# Patient Record
Sex: Female | Born: 1935 | Race: White | Hispanic: No | Smoking: Former smoker
Health system: Southern US, Community
[De-identification: ages and names within clinical notes are randomized; demographics above are authoritative.]

## PROBLEM LIST (undated history)

## (undated) DIAGNOSIS — M1990 Unspecified osteoarthritis, unspecified site: Secondary | ICD-10-CM

## (undated) DIAGNOSIS — K5792 Diverticulitis of intestine, part unspecified, without perforation or abscess without bleeding: Secondary | ICD-10-CM

## (undated) DIAGNOSIS — F0390 Unspecified dementia without behavioral disturbance: Secondary | ICD-10-CM

## (undated) DIAGNOSIS — R32 Unspecified urinary incontinence: Secondary | ICD-10-CM

## (undated) DIAGNOSIS — S82143A Displaced bicondylar fracture of unspecified tibia, initial encounter for closed fracture: Secondary | ICD-10-CM

## (undated) DIAGNOSIS — I10 Essential (primary) hypertension: Secondary | ICD-10-CM

## (undated) HISTORY — PX: COLON SURGERY: SHX602

## (undated) HISTORY — PX: APPENDECTOMY: SHX54

---

## 2000-01-29 ENCOUNTER — Encounter: Admission: RE | Admit: 2000-01-29 | Discharge: 2000-01-29 | Payer: Self-pay | Admitting: Internal Medicine

## 2000-01-29 ENCOUNTER — Encounter: Payer: Self-pay | Admitting: Internal Medicine

## 2001-01-30 ENCOUNTER — Encounter: Admission: RE | Admit: 2001-01-30 | Discharge: 2001-01-30 | Payer: Self-pay | Admitting: Internal Medicine

## 2001-01-30 ENCOUNTER — Encounter: Payer: Self-pay | Admitting: Internal Medicine

## 2002-02-16 ENCOUNTER — Encounter: Admission: RE | Admit: 2002-02-16 | Discharge: 2002-02-16 | Payer: Self-pay | Admitting: Internal Medicine

## 2002-02-16 ENCOUNTER — Encounter: Payer: Self-pay | Admitting: Internal Medicine

## 2003-01-02 ENCOUNTER — Encounter: Payer: Self-pay | Admitting: Otolaryngology

## 2003-01-02 ENCOUNTER — Encounter: Admission: RE | Admit: 2003-01-02 | Discharge: 2003-01-02 | Payer: Self-pay | Admitting: Otolaryngology

## 2003-01-04 ENCOUNTER — Ambulatory Visit (HOSPITAL_BASED_OUTPATIENT_CLINIC_OR_DEPARTMENT_OTHER): Admission: RE | Admit: 2003-01-04 | Discharge: 2003-01-04 | Payer: Self-pay | Admitting: Otolaryngology

## 2012-07-13 ENCOUNTER — Inpatient Hospital Stay (HOSPITAL_COMMUNITY)
Admission: EM | Admit: 2012-07-13 | Discharge: 2012-07-17 | DRG: 563 | Disposition: A | Discharge status: Skilled Nursing Facility | Payer: Medicare Other | Attending: Orthopedic Surgery | Admitting: Orthopedic Surgery

## 2012-07-13 ENCOUNTER — Emergency Department (HOSPITAL_COMMUNITY): Payer: Medicare Other

## 2012-07-13 ENCOUNTER — Encounter (HOSPITAL_COMMUNITY): Payer: Self-pay | Admitting: Emergency Medicine

## 2012-07-13 DIAGNOSIS — S82143A Displaced bicondylar fracture of unspecified tibia, initial encounter for closed fracture: Secondary | ICD-10-CM | POA: Diagnosis present

## 2012-07-13 DIAGNOSIS — A499 Bacterial infection, unspecified: Secondary | ICD-10-CM | POA: Diagnosis present

## 2012-07-13 DIAGNOSIS — Y92410 Unspecified street and highway as the place of occurrence of the external cause: Secondary | ICD-10-CM

## 2012-07-13 DIAGNOSIS — S82109A Unspecified fracture of upper end of unspecified tibia, initial encounter for closed fracture: Principal | ICD-10-CM | POA: Diagnosis present

## 2012-07-13 DIAGNOSIS — R64 Cachexia: Secondary | ICD-10-CM | POA: Diagnosis present

## 2012-07-13 DIAGNOSIS — IMO0002 Reserved for concepts with insufficient information to code with codable children: Secondary | ICD-10-CM

## 2012-07-13 DIAGNOSIS — N390 Urinary tract infection, site not specified: Secondary | ICD-10-CM | POA: Diagnosis present

## 2012-07-13 DIAGNOSIS — H9190 Unspecified hearing loss, unspecified ear: Secondary | ICD-10-CM | POA: Diagnosis present

## 2012-07-13 HISTORY — DX: Diverticulitis of intestine, part unspecified, without perforation or abscess without bleeding: K57.92

## 2012-07-13 HISTORY — DX: Unspecified urinary incontinence: R32

## 2012-07-13 HISTORY — DX: Displaced bicondylar fracture of unspecified tibia, initial encounter for closed fracture: S82.143A

## 2012-07-13 LAB — BASIC METABOLIC PANEL
CO2: 20 mEq/L (ref 19–32)
Calcium: 9.8 mg/dL (ref 8.4–10.5)
Creatinine, Ser: 0.77 mg/dL (ref 0.50–1.10)
GFR calc non Af Amer: 72 mL/min — ABNORMAL LOW (ref >90)
Glucose, Bld: 116 mg/dL — ABNORMAL HIGH (ref 70–99)
Sodium: 139 mEq/L (ref 135–145)

## 2012-07-13 LAB — CBC
MCH: 30.6 pg (ref 26.0–34.0)
MCHC: 35.9 g/dL (ref 30.0–36.0)
MCV: 85.1 fL (ref 78.0–100.0)
Platelets: DECREASED 10*3/uL (ref 150–400)
RBC: 3.76 MIL/uL — ABNORMAL LOW (ref 3.87–5.11)

## 2012-07-13 MED ORDER — MORPHINE SULFATE 2 MG/ML IJ SOLN
2.0000 mg | INTRAMUSCULAR | Status: DC | PRN
  Administered 2012-07-13: 4 mg via INTRAVENOUS
  Filled 2012-07-13: qty 2

## 2012-07-13 MED ORDER — BISACODYL 10 MG RE SUPP: 10.0000 mg | Freq: Every day | RECTAL | Status: DC | PRN

## 2012-07-13 MED ORDER — SENNA-DOCUSATE SODIUM 8.6-50 MG PO TABS: 1.0000 | ORAL_TABLET | Freq: Every day | ORAL | Status: DC

## 2012-07-13 MED ORDER — HYDROCODONE-ACETAMINOPHEN 5-325 MG PO TABS: 1.0000 | ORAL_TABLET | Freq: Four times a day (QID) | ORAL | Status: AC | PRN

## 2012-07-13 MED ORDER — SODIUM CHLORIDE 0.9 % IV SOLN
INTRAVENOUS | Status: DC
  Administered 2012-07-13: 19:00:00 via INTRAVENOUS

## 2012-07-13 MED ORDER — ENOXAPARIN SODIUM 40 MG/0.4ML ~~LOC~~ SOLN: 30.0000 mg | SUBCUTANEOUS | Status: DC

## 2012-07-13 MED ORDER — SENNA 8.6 MG PO TABS
1.0000 | ORAL_TABLET | Freq: Two times a day (BID) | ORAL | Status: DC
  Administered 2012-07-13 – 2012-07-17 (×8): 8.6 mg via ORAL
  Filled 2012-07-13 (×8): qty 1

## 2012-07-13 MED ORDER — HYDROCODONE-ACETAMINOPHEN 5-325 MG PO TABS
1.0000 | ORAL_TABLET | Freq: Four times a day (QID) | ORAL | Status: DC | PRN
  Administered 2012-07-13 – 2012-07-15 (×6): 1 via ORAL
  Administered 2012-07-15 – 2012-07-16 (×4): 2 via ORAL
  Administered 2012-07-16: 1 via ORAL
  Administered 2012-07-17: 2 via ORAL
  Filled 2012-07-13 (×3): qty 2
  Filled 2012-07-13 (×2): qty 1
  Filled 2012-07-13: qty 2
  Filled 2012-07-13 (×4): qty 1
  Filled 2012-07-13: qty 2
  Filled 2012-07-13: qty 1

## 2012-07-13 MED ORDER — MAGNESIUM CITRATE PO SOLN: 1.0000 | Freq: Once | ORAL | Status: AC | PRN

## 2012-07-13 MED ORDER — MORPHINE SULFATE 2 MG/ML IJ SOLN
0.5000 mg | INTRAMUSCULAR | Status: DC | PRN
  Administered 2012-07-14: 0.5 mg via INTRAVENOUS
  Filled 2012-07-13: qty 1

## 2012-07-13 MED ORDER — ACETAMINOPHEN 325 MG PO TABS: 650.0000 mg | ORAL_TABLET | Freq: Four times a day (QID) | ORAL | Status: DC | PRN

## 2012-07-13 MED ORDER — BISACODYL 5 MG PO TBEC: 5.0000 mg | DELAYED_RELEASE_TABLET | Freq: Every day | ORAL | Status: AC | PRN

## 2012-07-13 MED ORDER — DOCUSATE SODIUM 100 MG PO CAPS
100.0000 mg | ORAL_CAPSULE | Freq: Two times a day (BID) | ORAL | Status: DC
  Administered 2012-07-13 – 2012-07-17 (×8): 100 mg via ORAL
  Filled 2012-07-13 (×5): qty 1

## 2012-07-13 MED ORDER — ENOXAPARIN SODIUM 30 MG/0.3ML ~~LOC~~ SOLN
30.0000 mg | SUBCUTANEOUS | Status: DC
  Administered 2012-07-13 – 2012-07-15 (×3): 30 mg via SUBCUTANEOUS
  Filled 2012-07-13 (×5): qty 0.3
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
## 2012-07-14 ENCOUNTER — Encounter (HOSPITAL_COMMUNITY): Payer: Self-pay | Admitting: Orthopedic Surgery

## 2012-07-14 DIAGNOSIS — S82143A Displaced bicondylar fracture of unspecified tibia, initial encounter for closed fracture: Secondary | ICD-10-CM

## 2012-07-14 DIAGNOSIS — A499 Bacterial infection, unspecified: Secondary | ICD-10-CM | POA: Diagnosis present

## 2012-07-14 HISTORY — DX: Displaced bicondylar fracture of unspecified tibia, initial encounter for closed fracture: S82.143A

## 2012-07-14 LAB — URINALYSIS, ROUTINE W REFLEX MICROSCOPIC
Glucose, UA: NEGATIVE mg/dL
Ketones, ur: 15 mg/dL — AB
Protein, ur: NEGATIVE mg/dL
Urobilinogen, UA: 0.2 mg/dL (ref 0.0–1.0)

## 2012-07-14 LAB — URINE MICROSCOPIC-ADD ON

## 2012-07-14 MED ORDER — DIPHENHYDRAMINE HCL 12.5 MG/5ML PO ELIX: 6.2500 mg | ORAL_SOLUTION | Freq: Four times a day (QID) | ORAL | Status: DC | PRN

## 2012-07-14 MED ORDER — DIPHENHYDRAMINE-ZINC ACETATE 2-0.1 % EX CREA
TOPICAL_CREAM | Freq: Two times a day (BID) | CUTANEOUS | Status: DC | PRN
  Administered 2012-07-16: 02:00:00 via TOPICAL
  Filled 2012-07-14: qty 28

## 2012-07-14 MED ORDER — POTASSIUM CHLORIDE IN NACL 20-0.45 MEQ/L-% IV SOLN
INTRAVENOUS | Status: DC
  Administered 2012-07-14 – 2012-07-17 (×5): via INTRAVENOUS
  Filled 2012-07-14 (×7): qty 1000

## 2012-07-14 MED ORDER — ONDANSETRON HCL 4 MG/2ML IJ SOLN
4.0000 mg | Freq: Four times a day (QID) | INTRAMUSCULAR | Status: DC | PRN
  Administered 2012-07-14 – 2012-07-16 (×2): 4 mg via INTRAVENOUS
  Filled 2012-07-14 (×2): qty 2

## 2012-07-14 MED ORDER — CIPROFLOXACIN IN D5W 200 MG/100ML IV SOLN
200.0000 mg | Freq: Two times a day (BID) | INTRAVENOUS | Status: AC
  Administered 2012-07-14 – 2012-07-17 (×6): 200 mg via INTRAVENOUS
  Filled 2012-07-14 (×7): qty 100

## 2012-07-14 MED ORDER — ENSURE COMPLETE PO LIQD
237.0000 mL | Freq: Two times a day (BID) | ORAL | Status: DC
  Administered 2012-07-14 – 2012-07-17 (×6): 237 mL via ORAL

## 2012-07-14 MED ORDER — MORPHINE SULFATE 2 MG/ML IJ SOLN
1.0000 mg | INTRAMUSCULAR | Status: DC | PRN
  Administered 2012-07-17: 1 mg via INTRAVENOUS
  Filled 2012-07-14: qty 1

## 2012-07-14 NOTE — Care Management Note (Signed)
    Page 1 of 1   07/18/2012     8:50:34 AM   CARE MANAGEMENT NOTE 07/18/2012  Patient:  Cathy Blackwell, Cathy Blackwell   Account Number:  [id number]  Date Initiated:  07/14/2012  Documentation initiated by:  Lanier Clam  Subjective/Objective Assessment:   ADMITTED W/R KNEE PAIN.     Action/Plan:   FROM HOME ALONE.   Anticipated DC Date:  07/14/2012   Anticipated DC Plan:  SKILLED NURSING FACILITY  In-house referral  Clinical Social Worker      DC Planning Services  CM consult      Choice offered to / List presented to:             Status of service:  Completed, signed off Medicare Important Message given?  NA - LOS <3 / Initial given by admissions (If response is "NO", the following Medicare IM given date fields will be blank) Date Medicare IM given:   Date Additional Medicare IM given:    Discharge Disposition:  SKILLED NURSING FACILITY  Per UR Regulation:  Reviewed for med. necessity/level of care/duration of stay  If discussed at Long Length of Stay Meetings, dates discussed:    Comments:  07/14/12 KATHY MAHABIR RN,BSN NCM [phone] D/C PLANS SNF. WILL CONTINUE TO FOLLOW FOR D/C PLANS.
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
## 2012-07-14 NOTE — Progress Notes (Signed)
INITIAL ADULT NUTRITION ASSESSMENT Date: 07/14/2012   Time: 12:59 PM Reason for Assessment: Nutrition risk, weight loss, decreased intake  ASSESSMENT: Female 78 y.o.  Dx: Right tibial plateau fracture  Hx:  Past Medical History  Diagnosis Date  . Incontinence of urine   . Diverticulitis    Past Surgical History  Procedure Date  . Colon surgery     Part  of colon removed    Related Meds:     . docusate sodium  100 mg Oral BID  . enoxaparin (LOVENOX) injection  30 mg Subcutaneous Q24H  . senna  1 tablet Oral BID     Ht: 5' (152.4 cm)  Wt: 117 lb 8.1 oz (53.3 kg)  Ideal Wt: 45.5 kg  % Ideal Wt: 117  Usual Wt:  120# % Usual Wt: 98  Body mass index is 22.95 kg/(m^2).    Labs:  CMP     Component Value Date/Time   NA 139 07/13/2012 1900   K 3.7 07/13/2012 1900   CL 105 07/13/2012 1900   CO2 20 07/13/2012 1900   GLUCOSE 116* 07/13/2012 1900   BUN 19 07/13/2012 1900   CREATININE 0.77 07/13/2012 1900   CALCIUM 9.8 07/13/2012 1900   GFRNONAA 72* 07/13/2012 1900   GFRAA 83* 07/13/2012 1900    I/O last 3 completed shifts: In: 360 [P.O.:360] Out: -  Total I/O In: 120 [P.O.:120] Out: -    Diet Order: Regular  Supplements/Tube Feeding:  none  IVF:    DISCONTD: sodium chloride Last Rate: 125 mL/hr at 07/13/12 1919    Estimated Nutritional Needs:   Kcal: 1200-1300 Protein: 65-75 gm Fluid: .1.3L  Food/Nutrition Related Hx: Pt followed regular diet prior to admit.  Lived independently and usually ate only 1 meal per day.  Pt reported that patient did not eat well prior to admit.  Current intake poor. Dislikes food here.  No difficulty chewing or swallowing.  Pt has own teeth.  Very HOH.  Pt appears very thin and expect some decree of malnutrition.  Mild malnutrition related to intake of only 1 meal per day (poor intake per family) AEB < 75% intake for greater than one month, decreased body fat and muscle mass.  NUTRITION DIAGNOSIS: -Inadequate oral intake  (NI-2.1).  Status: Ongoing  RELATED TO: decreased appetite and dislike of food  AS EVIDENCE BY: reported poor intake  MONITORING/EVALUATION(Goals): Monitor:  Intake, labs, weight Goal:  Intake >50% meals and supplements to meet estimated needs  EDUCATION NEEDS: -No education needs identified at this time  INTERVENTION: Encourage po and provide preferences Ensure Complete bid  Dietitian [phone]  DOCUMENTATION CODES Per approved criteria  -Severe  malnutrition in the context of social or environmental circumstances    Derrell Lolling Anastasia Fiedler 07/14/2012, 12:59 PM
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
## 2012-07-14 NOTE — Progress Notes (Signed)
Clinical Social Work Department CLINICAL SOCIAL WORK PLACEMENT NOTE 07/14/2012  Patient:  Cathy Blackwell, Cathy Blackwell  Account Number:  [id number] Admit date:  07/13/2012  Clinical Social Worker:  Cori Razor, LCSW  Date/time:     Clinical Social Work is seeking post-discharge placement for this patient at the following level of care:   SKILLED NURSING   (*CSW will update this form in Epic as items are completed)   07/14/2012  Patient/family provided with [facility] Department of Clinical Social Work's list of facilities offering this level of care within the geographic area requested by the patient (or if unable, by the patient's family).  07/14/2012  Patient/family informed of their freedom to choose among providers that offer the needed level of care, that participate in Medicare, Medicaid or managed care program needed by the patient, have an available bed and are willing to accept the patient.    Patient/family informed of [facility]' ownership interest in [facility], as well as of the fact that they are under no obligation to receive care at this facility.  PASARR submitted to EDS on 07/14/2012 PASARR number received from EDS on 07/14/2012  FL2 transmitted to all facilities in geographic area requested by pt/family on  07/14/2012 FL2 transmitted to all facilities within larger geographic area on   Patient informed that his/her managed care company has contracts with or will negotiate with  certain facilities, including the following:     Patient/family informed of bed offers received:  07/14/2012 Patient chooses bed at  Physician recommends and patient chooses bed at    Patient to be transferred to  on   Patient to be transferred to facility by   The following physician request were entered in Epic:   Additional Comments:  Cori Razor LCSW [phone]

## 2012-07-14 NOTE — Progress Notes (Signed)
Physical Therapy Treatment Note   07/14/12 1500  PT Visit Information  Last PT Received On 07/14/12  Assistance Needed +1  PT Time Calculation  PT Start Time 1442  PT Stop Time 1450  PT Time Calculation (min) 8 min  Subjective Data  Subjective Nothing helps (with the pain)  Precautions  Precautions Knee  Required Braces or Orthoses Knee Immobilizer - Right  Knee Immobilizer - Right On at all times  Restrictions  Weight Bearing Restrictions Yes  RLE Weight Bearing NWB  Cognition  Overall Cognitive Status Appears within functional limits for tasks assessed/performed  Bed Mobility  Bed Mobility Sit to Supine;Scooting to HOB  Sit to Supine 4: Min assist;HOB flat  Scooting to [facility] 5: Supervision  Details for Bed Mobility Assistance assisted R LE onto bed, pt able to scoot up in bed without assist  Transfers  Transfers Sit to Stand;Stand to Sit;Stand Pivot Transfers  Sit to Stand 4: Min assist;With upper extremity assist;From chair/3-in-1  Stand to Sit 4: Min assist;To bed  Stand Pivot Transfers 4: Min assist  Details for Transfer Assistance verbal cues for safe technique however pt attempting to sit when not all the way next to bed to assisted pt with sitting for safety, increased verbal and visual cues for NWB as pt would demonstrate occasional TDWB  PT - End of Session  Equipment Utilized During Treatment Gait belt;Right knee immobilizer  Activity Tolerance Patient limited by pain  Patient left in bed;with call bell/phone within reach;with family/visitor present  PT - Assessment/Plan  Comments on Treatment Session Pt continues to reports 10/10 pain.  Assisted pt back to bed and applied ice packs for pain and R knee swelling.  PT Plan Discharge plan remains appropriate;Frequency remains appropriate  Follow Up Recommendations Skilled nursing facility  Equipment Recommended Defer to next venue  Acute Rehab PT Goals  PT Goal: Sit to Supine/Side - Progress Progressing toward goal    PT Goal: Sit to Stand - Progress Progressing toward goal  PT Goal: Stand to Sit - Progress Progressing toward goal  PT General Charges  $$ ACUTE PT VISIT 1 Procedure  PT Treatments  $Therapeutic Activity 8-22 mins    Zenovia Jarred, PT Pager: [phone]
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
## 2012-07-14 NOTE — Progress Notes (Signed)
Clinical Social Work Department BRIEF PSYCHOSOCIAL ASSESSMENT 07/14/2012  Patient:  Cathy Blackwell, Cathy Blackwell     Account Number:  [id number]     Admit date:  07/13/2012  Clinical Social Worker:  Candie Chroman  Date/Time:  07/14/2012 11:58 AM  Referred by:  Physician  Date Referred:  07/14/2012 Referred for  SNF Placement   Other Referral:   Interview type:  Family Other interview type:    PSYCHOSOCIAL DATA Living Status:  ALONE Admitted from facility:   Level of care:   Primary support name:  Catalina Gravel Primary support relationship to patient:  CHILD, ADULT Degree of support available:   supportive    CURRENT CONCERNS Current Concerns  Post-Acute Placement   Other Concerns:    SOCIAL WORK ASSESSMENT / PLAN Pt is a 78 yr old female living at home prior to hospitalization. CSW met with pt/daughter to assist with d/c planning. SNF placement is required at this time. CSW will meet again today with pt/daughter and son inlaw to review SNF bed  offers  placement.   Assessment/plan status:  Psychosocial Support/Ongoing Assessment of Needs Other assessment/ plan:   Information/referral to community resources:   SNF list provided.    PATIENT'S/FAMILY'S RESPONSE TO PLAN OF CARE: Pt/family are aware that SNF placement is needed..Additional questions will be addressed during this afternoon's visit.    Cori Razor LCSW [phone]

## 2012-07-15 LAB — BASIC METABOLIC PANEL
CO2: 27 mEq/L (ref 19–32)
Calcium: 9 mg/dL (ref 8.4–10.5)
Chloride: 102 mEq/L (ref 96–112)
Potassium: 4 mEq/L (ref 3.5–5.1)
Sodium: 140 mEq/L (ref 135–145)

## 2012-07-15 LAB — CBC
Hemoglobin: 10 g/dL — ABNORMAL LOW (ref 12.0–15.0)
Platelets: 83 10*3/uL — ABNORMAL LOW (ref 150–400)
RBC: 3.3 MIL/uL — ABNORMAL LOW (ref 3.87–5.11)
WBC: 9.3 10*3/uL (ref 4.0–10.5)
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
## 2012-07-15 NOTE — Progress Notes (Signed)
Pt transferred to 1532. 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 Mykala Mccready, [employer]

## 2012-07-15 NOTE — Plan of Care (Signed)
Problem: Phase III Progression Outcomes Goal: Foley discontinued Outcome: Not Applicable Date Met:  07/15/12 No foley

## 2012-07-15 NOTE — Progress Notes (Signed)
Gave daughter bed offers.  Daughter hopeful that [facility] will offer a bed.  Answered daughter's questions re: HCPOA.  Provided daughter with HCPOA documents and offered assistance with completion and notarizing.  No additional needs identified, at this time.  Weekday CSW to f/u.  Providence Crosby, LCSWA Clinical Social Work [phone]

## 2012-07-16 LAB — BASIC METABOLIC PANEL
CO2: 30 mEq/L (ref 19–32)
Chloride: 101 mEq/L (ref 96–112)
GFR calc non Af Amer: 76 mL/min — ABNORMAL LOW (ref >90)
Glucose, Bld: 104 mg/dL — ABNORMAL HIGH (ref 70–99)
Potassium: 4 mEq/L (ref 3.5–5.1)
Sodium: 135 mEq/L (ref 135–145)

## 2012-07-16 NOTE — Progress Notes (Signed)
Physical Therapy Treatment Patient Details Name: Cathy Blackwell MRN: [id number] DOB: Dec 25, 1918 Today's Date: 07/16/2012 Time: 4098-1191 PT Time Calculation (min): 19 min  PT Assessment / Plan / Recommendation Comments on Treatment Session  Pt required increased assist during today's session with pt unable to maintain upright stance without +2 assist.  Max cuing provided for safety and technique.      Follow Up Recommendations  Skilled nursing facility    Barriers to Discharge        Equipment Recommendations  Defer to next venue    Recommendations for Other Services    Frequency Min 3X/week   Plan Discharge plan remains appropriate;Frequency remains appropriate    Precautions / Restrictions Precautions Precautions: Knee Required Braces or Orthoses: Knee Immobilizer - Right Knee Immobilizer - Right: On at all times Restrictions Weight Bearing Restrictions: Yes RLE Weight Bearing: Non weight bearing   Pertinent Vitals/Pain 4/10    Mobility  Bed Mobility Bed Mobility: Supine to Sit Supine to Sit: 4: Min assist;HOB elevated Details for Bed Mobility Assistance: Assist for RLE out of bed with cues for hand placement to self assist trunk.   Transfers Transfers: Sit to Stand;Stand to Sit;Stand Pivot Transfers Sit to Stand: 1: +2 Total assist;With upper extremity assist;From elevated surface;From bed Sit to Stand: Patient Percentage: 60% Stand to Sit: 1: +2 Total assist;With upper extremity assist;With armrests;To chair/3-in-1 Stand to Sit: Patient Percentage: 30% Stand Pivot Transfers: 1: +2 Total assist Stand Pivot Transfers: Patient Percentage: 40% Details for Transfer Assistance: +2 assist to maintain upright stance throughout transfer with max cuing for maintaining NWB status and safety due to pt trying to sit without chair right behind her.  Ambulation/Gait Ambulation/Gait Assistance: Not tested (comment)    Exercises     PT Diagnosis:    PT Problem List:   PT  Treatment Interventions:     PT Goals Acute Rehab PT Goals PT Goal Formulation: With patient Time For Goal Achievement: 07/21/12 Potential to Achieve Goals: Good Pt will go Supine/Side to Sit: with supervision PT Goal: Supine/Side to Sit - Progress: Progressing toward goal Pt will go Sit to Stand: with supervision PT Goal: Sit to Stand - Progress: Not progressing (due to weakness/fatigue) Pt will go Stand to Sit: with supervision PT Goal: Stand to Sit - Progress: Not progressing (due to weakness/fatigue)  Visit Information  Last PT Received On: 07/16/12 Assistance Needed: +2    Subjective Data  Subjective: Its not really pain I have, I just don't feel good   Cognition  Overall Cognitive Status: Impaired Area of Impairment: Safety/judgement;Following commands Arousal/Alertness: Lethargic Orientation Level: Appears intact for tasks assessed Behavior During Session: Lethargic Following Commands: Follows multi-step commands inconsistently Safety/Judgement: Decreased awareness of safety precautions;Decreased safety judgement for tasks assessed Safety/Judgement - Other Comments: Provided max cuing for safety, however pt attempted to sit when chair not yet behind her.      Balance     End of Session PT - End of Session Equipment Utilized During Treatment: Gait belt;Right knee immobilizer Activity Tolerance: Patient limited by fatigue;Patient limited by pain Patient left: in chair;with call bell/phone within reach Nurse Communication: Mobility status   GP     Page, Meribeth Mattes 07/16/2012, 11:51 AM

## 2012-07-16 NOTE — Progress Notes (Signed)
Subjective: Doing ok per family who is present.     Objective: Vital signs in last 24 hours: Temp:  [97.6 F (36.4 C)-97.9 F (36.6 C)] 97.9 F (36.6 C) (09/01 0525) Pulse Rate:  [56-61] 61  (09/01 0525) Resp:  [14-16] 16  (09/01 1125) BP: (116-119)/(59-63) 119/63 mmHg (09/01 0525) SpO2:  [92 %-97 %] 92 % (09/01 0525)  Intake/Output from previous day: 08/31 0701 - 09/01 0700 In: 2828.8 [P.O.:360; I.V.:2468.8] Out: 1550 [Urine:1550] Intake/Output this shift: Total I/O In: 120 [P.O.:120] Out: 350 [Urine:350]   Basename 07/15/12 1315 07/13/12 1900  HGB 10.0* 11.5*    Basename 07/15/12 1315 07/13/12 1900  WBC 9.3 15.5*  RBC 3.30* 3.76*  HCT 28.7* 32.0*  PLT 83* PLATELET CLUMPS NOTED ON SMEAR, COUNT APPEARS DECREASED    Basename 07/16/12 0504 07/15/12 1315  NA 135 140  K 4.0 4.0  CL 101 102  CO2 30 27  BUN 11 13  CREATININE 0.64 0.70  GLUCOSE 104* 133*  CALCIUM 8.6 9.0   No results found for this basename: LABPT:2,INR:2 in the last 72 hours  Exam: min swelling right knee.  Calf nt    Assessment/Plan: Continue present care.  Waiting for transfer to snf.     OWENS,JAMES M 07/16/2012, 12:58 PM

## 2012-07-17 LAB — BASIC METABOLIC PANEL
BUN: 8 mg/dL (ref 6–23)
CO2: 29 mEq/L (ref 19–32)
Chloride: 103 mEq/L (ref 96–112)
GFR calc Af Amer: 87 mL/min — ABNORMAL LOW (ref >90)
Potassium: 4.1 mEq/L (ref 3.5–5.1)

## 2012-07-17 MED ORDER — HYDROCODONE-ACETAMINOPHEN 5-325 MG PO TABS: 1.0000 | ORAL_TABLET | Freq: Four times a day (QID) | ORAL | Status: AC | PRN

## 2012-07-17 MED ORDER — ENOXAPARIN SODIUM 30 MG/0.3ML ~~LOC~~ SOLN: 30.0000 mg | SUBCUTANEOUS | Status: DC

## 2012-07-17 NOTE — Progress Notes (Signed)
Clinical Social Work Department CLINICAL SOCIAL WORK PLACEMENT NOTE 07/17/2012  Patient:  Cathy Blackwell, Cathy Blackwell  Account Number:  [id number] Admit date:  07/13/2012  Clinical Social Worker:  Cori Razor, LCSW  Date/time:     Clinical Social Work is seeking post-discharge placement for this patient at the following level of care:   SKILLED NURSING   (*CSW will update this form in Epic as items are completed)   07/14/2012  Patient/family provided with [facility] Department of Clinical Social Work's Cathy Blackwell of facilities offering this level of care within the geographic area requested by the patient (or if unable, by the patient's family).  07/14/2012  Patient/family informed of their freedom to choose among providers that offer the needed level of care, that participate in Medicare, Medicaid or managed care program needed by the patient, have an available bed and are willing to accept the patient.    Patient/family informed of [facility]' ownership interest in [facility], as well as of the fact that they are under no obligation to receive care at this facility.  PASARR submitted to EDS on 07/14/2012 PASARR number received from EDS on 07/14/2012  FL2 transmitted to all facilities in geographic area requested by pt/family on  07/14/2012 FL2 transmitted to all facilities within larger geographic area on   Patient informed that his/her managed care company has contracts with or will negotiate with  certain facilities, including the following:     Patient/family informed of bed offers received:  07/14/2012 Patient chooses bed at [facility] Physician recommends and patient chooses bed at    Patient to be transferred to [facility] on  07/17/2012 Patient to be transferred to facility by EMS  The following physician request were entered in Epic:   Additional Comments:  Cori Razor LCSW [phone]

## 2012-07-17 NOTE — Progress Notes (Signed)
Subjective: Doing ok.  Bed available at snf.     Objective: Vital signs in last 24 hours: Temp:  [97.8 F (36.6 C)-98.2 F (36.8 C)] 97.8 F (36.6 C) (09/02 0442) Pulse Rate:  [55-64] 55  (09/02 0442) Resp:  [14-20] 20  (09/02 0442) BP: (151-155)/(58-71) 151/58 mmHg (09/02 0442) SpO2:  [96 %-97 %] 96 % (09/02 0442)  Intake/Output from previous day: 09/01 0701 - 09/02 0700 In: 170 [P.O.:170] Out: 2770 [Urine:2770] Intake/Output this shift: Total I/O In: 60 [P.O.:60] Out: 650 [Urine:650]   Basename 07/15/12 1315  HGB 10.0*    Basename 07/15/12 1315  WBC 9.3  RBC 3.30*  HCT 28.7*  PLT 83*    Basename 07/17/12 0405 07/16/12 0504  NA 137 135  K 4.1 4.0  CL 103 101  CO2 29 30  BUN 8 11  CREATININE 0.67 0.64  GLUCOSE 104* 104*  CALCIUM 9.3 8.6   No results found for this basename: LABPT:2,INR:2 in the last 72 hours  Exam:  Immobilizer on.  Nvi.   Assessment/Plan: Transfer to snf today.  Summary complete.     Khalise Billard M 07/17/2012, 12:06 PM

## 2012-07-17 NOTE — Progress Notes (Addendum)
CSW assisting with d/c planning to SNF. [facility] contacted this am to check bed availability. Awaiting return call from Admissions Coordinator. Expect to hear back by 10am. Will continue to follow to assist with d/c planning to SNF.  Cori Razor LCSW [phone]  [facility] contacted CSW with SNF bed offer. Bed is available today if pt is ready for d/c.   Cori Razor LCSW [phone]
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
## 2013-01-22 ENCOUNTER — Other Ambulatory Visit: Payer: Self-pay

## 2013-01-22 ENCOUNTER — Encounter (HOSPITAL_COMMUNITY): Payer: Self-pay | Admitting: *Deleted

## 2013-01-22 ENCOUNTER — Emergency Department (HOSPITAL_COMMUNITY): Payer: Medicare Other

## 2013-01-22 ENCOUNTER — Emergency Department (HOSPITAL_COMMUNITY)
Admission: EM | Admit: 2013-01-22 | Discharge: 2013-01-23 | Disposition: A | Discharge status: Skilled Nursing Facility | Payer: Medicare Other | Attending: Emergency Medicine | Admitting: Emergency Medicine

## 2013-01-22 DIAGNOSIS — Z87891 Personal history of nicotine dependence: Secondary | ICD-10-CM | POA: Insufficient documentation

## 2013-01-22 DIAGNOSIS — Y9210 Unspecified residential institution as the place of occurrence of the external cause: Secondary | ICD-10-CM | POA: Insufficient documentation

## 2013-01-22 DIAGNOSIS — Z8719 Personal history of other diseases of the digestive system: Secondary | ICD-10-CM | POA: Insufficient documentation

## 2013-01-22 DIAGNOSIS — R4182 Altered mental status, unspecified: Secondary | ICD-10-CM | POA: Insufficient documentation

## 2013-01-22 DIAGNOSIS — Z043 Encounter for examination and observation following other accident: Secondary | ICD-10-CM | POA: Insufficient documentation

## 2013-01-22 DIAGNOSIS — Z87828 Personal history of other (healed) physical injury and trauma: Secondary | ICD-10-CM | POA: Insufficient documentation

## 2013-01-22 DIAGNOSIS — J3489 Other specified disorders of nose and nasal sinuses: Secondary | ICD-10-CM | POA: Insufficient documentation

## 2013-01-22 DIAGNOSIS — N390 Urinary tract infection, site not specified: Secondary | ICD-10-CM | POA: Insufficient documentation

## 2013-01-22 DIAGNOSIS — Y939 Activity, unspecified: Secondary | ICD-10-CM | POA: Insufficient documentation

## 2013-01-22 DIAGNOSIS — W19XXXA Unspecified fall, initial encounter: Secondary | ICD-10-CM | POA: Insufficient documentation

## 2013-01-22 DIAGNOSIS — Z79899 Other long term (current) drug therapy: Secondary | ICD-10-CM | POA: Insufficient documentation

## 2013-01-22 DIAGNOSIS — R509 Fever, unspecified: Secondary | ICD-10-CM | POA: Insufficient documentation

## 2013-01-22 DIAGNOSIS — Z7982 Long term (current) use of aspirin: Secondary | ICD-10-CM | POA: Insufficient documentation

## 2013-01-22 LAB — POCT I-STAT, CHEM 8
BUN: 14 mg/dL (ref 6–23)
Calcium, Ion: 1.16 mmol/L (ref 1.13–1.30)
Chloride: 96 mEq/L (ref 96–112)

## 2013-01-22 LAB — CBC WITH DIFFERENTIAL/PLATELET
Basophils Relative: 1 % (ref 0–1)
Eosinophils Relative: 1 % (ref 0–5)
Hemoglobin: 11.8 g/dL — ABNORMAL LOW (ref 12.0–15.0)
Lymphs Abs: 1.7 10*3/uL (ref 0.7–4.0)
MCH: 31.4 pg (ref 26.0–34.0)
MCV: 88.3 fL (ref 78.0–100.0)
Monocytes Absolute: 0.7 10*3/uL (ref 0.1–1.0)
RBC: 3.76 MIL/uL — ABNORMAL LOW (ref 3.87–5.11)

## 2013-01-22 LAB — URINALYSIS, ROUTINE W REFLEX MICROSCOPIC
Bilirubin Urine: NEGATIVE
Specific Gravity, Urine: 1.017 (ref 1.005–1.030)
pH: 6 (ref 5.0–8.0)

## 2013-01-22 LAB — URINE MICROSCOPIC-ADD ON

## 2013-01-22 LAB — POCT I-STAT TROPONIN I: Troponin i, poc: 0.01 ng/mL (ref 0.00–0.08)

## 2013-01-22 LAB — CG4 I-STAT (LACTIC ACID): Lactic Acid, Venous: 1.33 mmol/L (ref 0.5–2.2)

## 2013-01-22 MED ORDER — SULFAMETHOXAZOLE-TMP DS 800-160 MG PO TABS: 1.0000 | ORAL_TABLET | Freq: Two times a day (BID) | ORAL | Status: AC

## 2013-01-22 MED ORDER — SULFAMETHOXAZOLE-TMP DS 800-160 MG PO TABS
1.0000 | ORAL_TABLET | Freq: Once | ORAL | Status: AC
  Administered 2013-01-22: 1 via ORAL
  Filled 2013-01-22: qty 1

## 2013-01-22 NOTE — ED Notes (Signed)
Patient transported to X-ray 

## 2013-01-22 NOTE — ED Provider Notes (Signed)
History     CSN: [id number]  Arrival date & time 01/22/13  1933   First MD Initiated Contact with Patient 01/22/13 2121      Chief Complaint  Patient presents with  . Fall  . Urinary Tract Infection    HPI  The patient presents after a series of falls, with generalized complaints.  She denies any focal pain, confusion, disorientation, nausea. Per report the patient had 2 falls within the past 24 hours, both from a seated height.  There is also report of new cough, congestion, fever of 101 at the patient's care facility. History of present illness is per the patient's family members. The patient herself states that she was in her usual state of health prior to the onset of symptoms.     Past Medical History  Diagnosis Date  . Incontinence of urine   . Diverticulitis   . Fracture of tibial plateau, closed 07/14/2012    Past Surgical History  Procedure Laterality Date  . Colon surgery      Part  of colon removed  . Fracture surgery    . Appendectomy      No family history on file.  History  Substance Use Topics  . Smoking status: Former Smoker -- 10 years    Quit date: 07/13/1969  . Smokeless tobacco: Never Used  . Alcohol Use: No     Comment: quit drinking in late 1970's - "was a heavy drinker at one time"    OB History   Grav Para Term Preterm Abortions TAB SAB Ect Mult Living                  Review of Systems  Constitutional:       Per HPI, otherwise negative  HENT:       Per HPI, otherwise negative  Respiratory:       Per HPI, otherwise negative  Cardiovascular:       Per HPI, otherwise negative  Gastrointestinal: Negative for nausea, vomiting and diarrhea.  Endocrine:       Negative aside from HPI  Genitourinary:       Neg aside from HPI   Musculoskeletal:       Per HPI, otherwise negative  Skin: Negative.   Neurological: Negative for dizziness, weakness, light-headedness and numbness.    Allergies  Review of patient's allergies indicates  no known allergies.  Home Medications   Current Outpatient Rx  Name  Route  Sig  Dispense  Refill  . aspirin 81 MG chewable tablet   Oral   Chew 81 mg by mouth daily.         . calcium-vitamin D (OSCAL WITH D) 500-200 MG-UNIT per tablet   Oral   Take 1 tablet by mouth daily.         Marland Kitchen loperamide (IMODIUM A-D) 2 MG tablet   Oral   Take 4 mg by mouth as needed for diarrhea or loose stools.         . mirtazapine (REMERON) 7.5 MG tablet   Oral   Take 7.5 mg by mouth at bedtime.         Marland Kitchen QUEtiapine (SEROQUEL) 25 MG tablet   Oral   Take 25 mg by mouth at bedtime.           BP 110/56  Pulse 84  Temp(Src) 99.9 F (37.7 C) (Oral)  Resp 18  SpO2 94%  Physical Exam  Nursing note and vitals reviewed. Constitutional: She is oriented  to person, place, and time. She appears well-developed and well-nourished. No distress.  HENT:  Head: Normocephalic and atraumatic.  Eyes: Conjunctivae and EOM are normal.  Neck: Neck supple. No spinous process tenderness present. No rigidity. No edema present.  Cardiovascular: Normal rate and regular rhythm.   Murmur heard. Pulmonary/Chest: Effort normal and breath sounds normal. No stridor. No respiratory distress.  Abdominal: She exhibits no distension.  Musculoskeletal: She exhibits no edema.  Lymphadenopathy:    She has no cervical adenopathy.  Neurological: She is alert and oriented to person, place, and time. No cranial nerve deficit. She exhibits normal muscle tone. Coordination normal.  Skin: Skin is warm and dry.  Psychiatric: She has a normal mood and affect.    ED Course  Procedures (including critical care time)  Labs Reviewed  URINALYSIS, ROUTINE W REFLEX MICROSCOPIC  CBC WITH DIFFERENTIAL   No results found.   No diagnosis found.  Cardiac 95 sinus rhythm normal Pulse ox 99% room air normal    Date: 01/22/2013  Rate: 72  Rhythm: normal sinus rhythm  QRS Axis: left  Intervals: normal  ST/T Wave  abnormalities: nonspecific ST/T changes  Conduction Disutrbances:none  Narrative Interpretation:   Old EKG Reviewed: unchanged  Borderline  11:34 PM On repeat exam there are no notable changes.  Patient remains, appearing.  Results discussed with the patient and her family  MDM  This elderly female presents after several falls, with new temperature.  On my exam she is in no distress.  Vital signs are stable aside from mild temperature.  The patient's labs, urinalysis are consistent with a urinary tract infection.  There is no evidence of pneumonia, nor significant suspicion of systemic systemic illness.  The patient is a resident of a assisted care facility, was discharged to this location.        Gerhard Munch, MD 01/22/13 [phone]

## 2013-01-22 NOTE — ED Notes (Signed)
Pt accompanied by family daughter and son in law, lives at [place]. Facility had concerns with pt urinary status. She had temp of 101.1 at the AL facility and needed to be further assessed. Family Stated when they came to see patient she seemed to be have unsteady gait and  Dizzy. Reported the she fell at facility on Wednesday and OOB this a.m.    denies acute change in mental status but states she is not herself. At baseline pt is more spry.

## 2013-01-24 LAB — URINE CULTURE

## 2013-01-26 NOTE — ED Notes (Signed)
+   Urine Patient treated with Bactrim-sensitive to same-chart appended per protocol MD. 

## 2013-03-01 ENCOUNTER — Inpatient Hospital Stay (HOSPITAL_COMMUNITY)
Admission: EM | Admit: 2013-03-01 | Discharge: 2013-03-05 | DRG: 549 | Disposition: A | Discharge status: Skilled Nursing Facility | Payer: Medicare Other | Attending: Internal Medicine | Admitting: Internal Medicine

## 2013-03-01 DIAGNOSIS — D696 Thrombocytopenia, unspecified: Secondary | ICD-10-CM | POA: Diagnosis present

## 2013-03-01 DIAGNOSIS — M25532 Pain in left wrist: Secondary | ICD-10-CM

## 2013-03-01 DIAGNOSIS — F329 Major depressive disorder, single episode, unspecified: Secondary | ICD-10-CM | POA: Diagnosis present

## 2013-03-01 DIAGNOSIS — A499 Bacterial infection, unspecified: Secondary | ICD-10-CM

## 2013-03-01 DIAGNOSIS — M25432 Effusion, left wrist: Secondary | ICD-10-CM | POA: Diagnosis present

## 2013-03-01 DIAGNOSIS — M009 Pyogenic arthritis, unspecified: Principal | ICD-10-CM | POA: Diagnosis present

## 2013-03-01 DIAGNOSIS — N390 Urinary tract infection, site not specified: Secondary | ICD-10-CM | POA: Diagnosis present

## 2013-03-01 DIAGNOSIS — D72829 Elevated white blood cell count, unspecified: Secondary | ICD-10-CM | POA: Diagnosis present

## 2013-03-01 DIAGNOSIS — E876 Hypokalemia: Secondary | ICD-10-CM | POA: Diagnosis present

## 2013-03-01 DIAGNOSIS — F3289 Other specified depressive episodes: Secondary | ICD-10-CM | POA: Diagnosis present

## 2013-03-01 HISTORY — DX: Unspecified osteoarthritis, unspecified site: M19.90

## 2013-03-01 NOTE — ED Notes (Signed)
Per family report: pt from a nursing home: pt began to complain of left hand pain.  Nursing home call family to pick her up.  Pt denies falling or injuring her hand.  Pt's hand is swollen compared to the right hand.  Pt has a good radial pulse, cap refill but limited ability to move fingers and bend wrist.

## 2013-03-02 ENCOUNTER — Encounter (HOSPITAL_COMMUNITY): Payer: Self-pay | Admitting: *Deleted

## 2013-03-02 ENCOUNTER — Inpatient Hospital Stay (HOSPITAL_COMMUNITY): Payer: Medicare Other | Admitting: Registered Nurse

## 2013-03-02 ENCOUNTER — Encounter (HOSPITAL_COMMUNITY)
Admission: EM | Disposition: A | Discharge status: Skilled Nursing Facility | Payer: Self-pay | Source: Home / Self Care | Attending: Internal Medicine

## 2013-03-02 ENCOUNTER — Emergency Department (HOSPITAL_COMMUNITY): Payer: Medicare Other

## 2013-03-02 ENCOUNTER — Encounter (HOSPITAL_COMMUNITY): Payer: Self-pay | Admitting: Registered Nurse

## 2013-03-02 DIAGNOSIS — M25539 Pain in unspecified wrist: Secondary | ICD-10-CM

## 2013-03-02 DIAGNOSIS — M25432 Effusion, left wrist: Secondary | ICD-10-CM | POA: Diagnosis present

## 2013-03-02 DIAGNOSIS — D72829 Elevated white blood cell count, unspecified: Secondary | ICD-10-CM | POA: Diagnosis present

## 2013-03-02 HISTORY — PX: I&D EXTREMITY: SHX5045

## 2013-03-02 LAB — URINALYSIS, ROUTINE W REFLEX MICROSCOPIC
Bilirubin Urine: NEGATIVE
Nitrite: NEGATIVE
Specific Gravity, Urine: 1.013 (ref 1.005–1.030)
Urobilinogen, UA: 0.2 mg/dL (ref 0.0–1.0)

## 2013-03-02 LAB — CBC WITH DIFFERENTIAL/PLATELET
Basophils Absolute: 0 10*3/uL (ref 0.0–0.1)
HCT: 30.1 % — ABNORMAL LOW (ref 36.0–46.0)
Hemoglobin: 11 g/dL — ABNORMAL LOW (ref 12.0–15.0)
Lymphocytes Relative: 24 % (ref 12–46)
Lymphs Abs: 3.2 10*3/uL (ref 0.7–4.0)
Lymphs Abs: 1.5 10*3/uL (ref 0.7–4.0)
MCH: 31.2 pg (ref 26.0–34.0)
MCV: 91.2 fL (ref 78.0–100.0)
Monocytes Relative: 10 % (ref 3–12)
Monocytes Relative: 7 % (ref 3–12)
Neutro Abs: 8.7 10*3/uL — ABNORMAL HIGH (ref 1.7–7.7)
Neutro Abs: 10.8 10*3/uL — ABNORMAL HIGH (ref 1.7–7.7)
Neutrophils Relative %: 65 % (ref 43–77)
RBC: 3.53 MIL/uL — ABNORMAL LOW (ref 3.87–5.11)
RDW: 14 % (ref 11.5–15.5)
WBC: 13.4 10*3/uL — ABNORMAL HIGH (ref 4.0–10.5)
WBC: 13.3 10*3/uL — ABNORMAL HIGH (ref 4.0–10.5)

## 2013-03-02 LAB — SYNOVIAL CELL COUNT + DIFF, W/ CRYSTALS
Crystals, Fluid: UNDETERMINED
Crystals, Fluid: NONE SEEN
Lymphocytes-Synovial Fld: 7 % (ref 0–20)
Lymphocytes-Synovial Fld: 9 % (ref 0–20)
Monocyte-Macrophage-Synovial Fluid: 2 % — ABNORMAL LOW (ref 50–90)
Monocyte-Macrophage-Synovial Fluid: 2 % — ABNORMAL LOW (ref 50–90)
Neutrophil, Synovial: 89 % — ABNORMAL HIGH (ref 0–25)
WBC, Synovial: UNDETERMINED /mm3 (ref 0–200)
WBC, Synovial: 9975 /mm3 — ABNORMAL HIGH (ref 0–200)

## 2013-03-02 LAB — GLUCOSE, CAPILLARY

## 2013-03-02 LAB — BASIC METABOLIC PANEL
BUN: 11 mg/dL (ref 6–23)
BUN: 11 mg/dL (ref 6–23)
CO2: 26 mEq/L (ref 19–32)
Chloride: 99 mEq/L (ref 96–112)
GFR calc Af Amer: >90 mL/min (ref >90)
GFR calc non Af Amer: 80 mL/min — ABNORMAL LOW (ref >90)
Glucose, Bld: 104 mg/dL — ABNORMAL HIGH (ref 70–99)
Potassium: 3.7 mEq/L (ref 3.5–5.1)
Potassium: 3.6 mEq/L (ref 3.5–5.1)
Sodium: 135 mEq/L (ref 135–145)
Sodium: 134 mEq/L — ABNORMAL LOW (ref 135–145)

## 2013-03-02 LAB — SURGICAL PCR SCREEN: Staphylococcus aureus: NEGATIVE

## 2013-03-02 SURGERY — IRRIGATION AND DEBRIDEMENT EXTREMITY
Anesthesia: General | Site: Wrist | Laterality: Left | Wound class: Dirty or Infected

## 2013-03-02 MED ORDER — DEXTROSE 5 % IV SOLN
1.0000 g | INTRAVENOUS | Status: DC
  Filled 2013-03-02: qty 10

## 2013-03-02 MED ORDER — MORPHINE SULFATE 2 MG/ML IJ SOLN
2.0000 mg | INTRAMUSCULAR | Status: DC | PRN
  Administered 2013-03-02: 1 mg via INTRAVENOUS
  Administered 2013-03-03 (×2): 2 mg via INTRAVENOUS
  Filled 2013-03-02 (×3): qty 1

## 2013-03-02 MED ORDER — ONDANSETRON HCL 4 MG/2ML IJ SOLN: 4.0000 mg | Freq: Four times a day (QID) | INTRAMUSCULAR | Status: DC | PRN

## 2013-03-02 MED ORDER — FENTANYL CITRATE 0.05 MG/ML IJ SOLN
INTRAMUSCULAR | Status: DC | PRN
  Administered 2013-03-02: 25 ug via INTRAVENOUS
  Administered 2013-03-02: 50 ug via INTRAVENOUS

## 2013-03-02 MED ORDER — MORPHINE SULFATE 2 MG/ML IJ SOLN
1.0000 mg | Freq: Four times a day (QID) | INTRAMUSCULAR | Status: DC | PRN
  Administered 2013-03-02: 1 mg via INTRAVENOUS
  Filled 2013-03-02: qty 1

## 2013-03-02 MED ORDER — QUETIAPINE FUMARATE 25 MG PO TABS
25.0000 mg | ORAL_TABLET | Freq: Every day | ORAL | Status: DC
  Administered 2013-03-02 – 2013-03-04 (×3): 25 mg via ORAL
  Filled 2013-03-02 (×4): qty 1

## 2013-03-02 MED ORDER — LIDOCAINE HCL 2 % IJ SOLN
20.0000 mL | Freq: Once | INTRAMUSCULAR | Status: AC
  Administered 2013-03-02: 400 mg
  Filled 2013-03-02: qty 20

## 2013-03-02 MED ORDER — MEPERIDINE HCL 50 MG/ML IJ SOLN: 6.2500 mg | INTRAMUSCULAR | Status: DC | PRN

## 2013-03-02 MED ORDER — BUPIVACAINE HCL (PF) 0.25 % IJ SOLN
INTRAMUSCULAR | Status: AC
  Filled 2013-03-02: qty 30

## 2013-03-02 MED ORDER — MORPHINE SULFATE 4 MG/ML IJ SOLN
4.0000 mg | Freq: Once | INTRAMUSCULAR | Status: AC
  Administered 2013-03-02: 4 mg via INTRAVENOUS
  Filled 2013-03-02: qty 1

## 2013-03-02 MED ORDER — SODIUM CHLORIDE 0.9 % IV SOLN
INTRAVENOUS | Status: DC
  Administered 2013-03-02: 12:00:00 via INTRAVENOUS

## 2013-03-02 MED ORDER — PIPERACILLIN-TAZOBACTAM 3.375 G IVPB
3.3750 g | Freq: Once | INTRAVENOUS | Status: AC
  Administered 2013-03-02: 3.375 g via INTRAVENOUS
  Filled 2013-03-02 (×2): qty 50

## 2013-03-02 MED ORDER — 0.9 % SODIUM CHLORIDE (POUR BTL) OPTIME
TOPICAL | Status: DC | PRN
  Administered 2013-03-02: 1000 mL

## 2013-03-02 MED ORDER — VANCOMYCIN HCL IN DEXTROSE 1-5 GM/200ML-% IV SOLN
1000.0000 mg | Freq: Once | INTRAVENOUS | Status: AC
  Administered 2013-03-02: 1000 mg via INTRAVENOUS
  Filled 2013-03-02: qty 200

## 2013-03-02 MED ORDER — DEXTROSE 5 % IV SOLN
1.0000 g | INTRAVENOUS | Status: DC
  Administered 2013-03-03 – 2013-03-05 (×3): 1 g via INTRAVENOUS
  Filled 2013-03-02 (×3): qty 10

## 2013-03-02 MED ORDER — OXYCODONE HCL 5 MG PO TABS
5.0000 mg | ORAL_TABLET | ORAL | Status: DC | PRN
  Administered 2013-03-02 – 2013-03-03 (×3): 5 mg via ORAL
  Filled 2013-03-02 (×3): qty 1

## 2013-03-02 MED ORDER — FENTANYL CITRATE 0.05 MG/ML IJ SOLN: 25.0000 ug | INTRAMUSCULAR | Status: DC | PRN

## 2013-03-02 MED ORDER — ADULT MULTIVITAMIN W/MINERALS CH
1.0000 | ORAL_TABLET | Freq: Every day | ORAL | Status: DC
  Administered 2013-03-02 – 2013-03-05 (×4): 1 via ORAL
  Filled 2013-03-02 (×4): qty 1

## 2013-03-02 MED ORDER — SODIUM CHLORIDE 0.9 % IR SOLN
Status: DC | PRN
  Administered 2013-03-02: 3000 mL

## 2013-03-02 MED ORDER — ACETAMINOPHEN 325 MG PO TABS
650.0000 mg | ORAL_TABLET | Freq: Four times a day (QID) | ORAL | Status: DC | PRN
  Administered 2013-03-04 – 2013-03-05 (×3): 650 mg via ORAL
  Filled 2013-03-02 (×3): qty 2

## 2013-03-02 MED ORDER — MIRTAZAPINE 7.5 MG PO TABS
7.5000 mg | ORAL_TABLET | Freq: Every day | ORAL | Status: DC
  Administered 2013-03-02 – 2013-03-04 (×3): 7.5 mg via ORAL
  Filled 2013-03-02 (×4): qty 1

## 2013-03-02 MED ORDER — DEXTROSE 5 % IV SOLN
1.0000 g | Freq: Once | INTRAVENOUS | Status: AC
  Administered 2013-03-02: 1 g via INTRAVENOUS
  Filled 2013-03-02: qty 10

## 2013-03-02 MED ORDER — ONDANSETRON HCL 4 MG PO TABS: 4.0000 mg | ORAL_TABLET | Freq: Four times a day (QID) | ORAL | Status: DC | PRN

## 2013-03-02 MED ORDER — PROMETHAZINE HCL 25 MG/ML IJ SOLN: 6.2500 mg | INTRAMUSCULAR | Status: DC | PRN

## 2013-03-02 MED ORDER — VANCOMYCIN HCL IN DEXTROSE 750-5 MG/150ML-% IV SOLN
750.0000 mg | INTRAVENOUS | Status: DC
  Administered 2013-03-03 – 2013-03-05 (×3): 750 mg via INTRAVENOUS
  Filled 2013-03-02 (×4): qty 150

## 2013-03-02 MED ORDER — BUPIVACAINE HCL 0.25 % IJ SOLN
INTRAMUSCULAR | Status: DC | PRN
  Administered 2013-03-02: 30 mL

## 2013-03-02 MED ORDER — ACETAMINOPHEN 650 MG RE SUPP: 650.0000 mg | Freq: Four times a day (QID) | RECTAL | Status: DC | PRN

## 2013-03-02 MED ORDER — LIDOCAINE HCL (CARDIAC) 10 MG/ML IV SOLN
INTRAVENOUS | Status: DC | PRN
  Administered 2013-03-02: 50 mg via INTRAVENOUS

## 2013-03-02 MED ORDER — MIDAZOLAM HCL 5 MG/5ML IJ SOLN
INTRAMUSCULAR | Status: DC | PRN
  Administered 2013-03-02: 0.5 mg via INTRAVENOUS

## 2013-03-02 MED ORDER — ENSURE COMPLETE PO LIQD
237.0000 mL | Freq: Two times a day (BID) | ORAL | Status: DC
  Administered 2013-03-03 – 2013-03-05 (×4): 237 mL via ORAL

## 2013-03-02 MED ORDER — ONDANSETRON HCL 4 MG/2ML IJ SOLN
INTRAMUSCULAR | Status: DC | PRN
  Administered 2013-03-02: 4 mg via INTRAVENOUS

## 2013-03-02 MED ORDER — PROPOFOL 10 MG/ML IV BOLUS
INTRAVENOUS | Status: DC | PRN
  Administered 2013-03-02: 90 mg via INTRAVENOUS

## 2013-03-02 SURGICAL SUPPLY — 35 items
BAG ZIPLOCK 12X15 (MISCELLANEOUS) ×2 IMPLANT
BANDAGE GAUZE ELAST BULKY 4 IN (GAUZE/BANDAGES/DRESSINGS) IMPLANT
BNDG ELASTIC 2 VLCR STRL LF (GAUZE/BANDAGES/DRESSINGS) ×4 IMPLANT
CANISTER SUCTION 2500CC (MISCELLANEOUS) IMPLANT
CLOTH BEACON ORANGE TIMEOUT ST (SAFETY) ×2 IMPLANT
CORDS BIPOLAR (ELECTRODE) IMPLANT
CUFF TOURN SGL QUICK 18 (TOURNIQUET CUFF) IMPLANT
CUFF TOURN SGL QUICK 24 (TOURNIQUET CUFF)
CUFF TRNQT CYL 24X4X40X1 (TOURNIQUET CUFF) IMPLANT
DRAIN PENROSE 18X1/2 LTX STRL (DRAIN) IMPLANT
DRAPE SURG 17X11 SM STRL (DRAPES) IMPLANT
DRSG XEROFORM 1X8 (GAUZE/BANDAGES/DRESSINGS) ×2 IMPLANT
ELECT REM PT RETURN 9FT ADLT (ELECTROSURGICAL) ×2
ELECTRODE REM PT RTRN 9FT ADLT (ELECTROSURGICAL) ×1 IMPLANT
GAUZE XEROFORM 1X8 LF (GAUZE/BANDAGES/DRESSINGS) ×2 IMPLANT
GLOVE BIOGEL M STRL SZ7.5 (GLOVE) ×2 IMPLANT
GLOVE BIOGEL PI IND STRL 6.5 (GLOVE) ×1 IMPLANT
GLOVE BIOGEL PI IND STRL 7.0 (GLOVE) ×1 IMPLANT
GLOVE BIOGEL PI IND STRL 7.5 (GLOVE) ×1 IMPLANT
GLOVE BIOGEL PI INDICATOR 6.5 (GLOVE) ×1
GLOVE BIOGEL PI INDICATOR 7.0 (GLOVE) ×1
GLOVE BIOGEL PI INDICATOR 7.5 (GLOVE) ×1
HANDPIECE INTERPULSE COAX TIP (DISPOSABLE)
KIT BASIN OR (CUSTOM PROCEDURE TRAY) ×2 IMPLANT
MANIFOLD NEPTUNE II (INSTRUMENTS) ×2 IMPLANT
NEEDLE HYPO 22GX1.5 SAFETY (NEEDLE) IMPLANT
NS IRRIG 1000ML POUR BTL (IV SOLUTION) ×2 IMPLANT
PACK LOWER EXTREMITY WL (CUSTOM PROCEDURE TRAY) ×2 IMPLANT
POSITIONER SURGICAL ARM (MISCELLANEOUS) IMPLANT
SET CYSTO W/LG BORE CLAMP LF (SET/KITS/TRAYS/PACK) ×2 IMPLANT
SET HNDPC FAN SPRY TIP SCT (DISPOSABLE) IMPLANT
SPONGE GAUZE 4X4 12PLY (GAUZE/BANDAGES/DRESSINGS) ×2 IMPLANT
SWAB COLLECTION DEVICE MRSA (MISCELLANEOUS) ×2 IMPLANT
SYR CONTROL 10ML LL (SYRINGE) IMPLANT
TUBE ANAEROBIC SPECIMEN COL (MISCELLANEOUS) IMPLANT

## 2013-03-02 NOTE — ED Notes (Signed)
[id number]<AV> Expected date:<BR> Expected time:<BR> Means of arrival:<BR> Comments:<BR> Hold for rm 2

## 2013-03-02 NOTE — ED Notes (Signed)
Pt sts to writer she does not want to use the ice pack.

## 2013-03-02 NOTE — Progress Notes (Signed)
ANTIBIOTIC CONSULT NOTE - INITIAL  Pharmacy Consult for vancomycin Indication: possible septic arthritis  No Known Allergies  Patient Measurements: Height: 5\' 2"  (157.5 cm) Weight: 112 lb (50.803 kg) IBW/kg (Calculated) : 50.1  Vital Signs: Temp: 97.7 F (36.5 C) (04/18 0747) Temp src: Oral (04/18 0747) BP: 131/55 mmHg (04/18 0747) Pulse Rate: 58 (04/18 0747) Intake/Output from previous day:   Intake/Output from this shift:    Labs:  Recent Labs  03/02/13 0205  WBC 13.4*  HGB 11.0*  PLT 138*  CREATININE 0.59   Estimated Creatinine Clearance: 36.2 ml/min (by C-G formula based on Cr of 0.59). No results found for this basename: VANCOTROUGH, VANCOPEAK, VANCORANDOM, GENTTROUGH, GENTPEAK, GENTRANDOM, TOBRATROUGH, TOBRAPEAK, TOBRARND, AMIKACINPEAK, AMIKACINTROU, AMIKACIN,  in the last 72 hours   Microbiology: No results found for this or any previous visit (from the past 720 hour(s)).  Medical History: Past Medical History  Diagnosis Date  . Incontinence of urine   . Diverticulitis   . Fracture of tibial plateau, closed 07/14/2012  . Osteoarthritis     Medications:  Scheduled:  . [COMPLETED] cefTRIAXone (ROCEPHIN)  IV  1 g Intravenous Once  . [START ON 03/03/2013] cefTRIAXone (ROCEPHIN)  IV  1 g Intravenous Q24H  . [COMPLETED] lidocaine  20 mL Infiltration Once  . mirtazapine  7.5 mg Oral QHS  . [COMPLETED]  morphine injection  4 mg Intravenous Once  . [COMPLETED]  morphine injection  4 mg Intravenous Once  . QUEtiapine  25 mg Oral QHS  . [COMPLETED] vancomycin  1,000 mg Intravenous Once  . [DISCONTINUED] cefTRIAXone (ROCEPHIN)  IV  1 g Intravenous Q24H   Infusions:  . sodium chloride     Assessment: 79 y.o. female history of depression who lives in nursing home was brought to the ER patient was complaining of increasing pain in her left wrist since last night. Patient's daughter states that patient may have had swelling and pain in the left wrist for last 2  days which has gradually worsened. Patient did not have any fever chills or any fall or trauma. X-rays done in the ER show possibility of CPPD arthritis. Since patient has significant pain and leukocytosis at this time patient has been admitted for further management. ER physician Dr. Patria Mane tried to aspirate the joint but was not successful. Differential includes gouty arthritis vs septic arthritis  Vancomycin per pharmacy dosing (Rocephin per Md)  Slightly elevated WBC, afebrile, CrCl ~36 ml/min   Goal of Therapy:  Vancomycin trough level 15-20 mcg/ml  Plan:  1) Vancomycin 750mg  IV q24 per current weight and estimated renal function 2) Vanc trough at steady state prn   Cathy Blackwell, PharmD, BCPS Pager [phone] 03/02/2013 10:21 AM

## 2013-03-02 NOTE — Consult Note (Signed)
Reason for Consult:swelling of L hand Referring Physician: ER  Cathy Blackwell is an 79 y.o. right handed female.  HPI: pt presented to ER with pain aand swelling of L wrist for a couple days, ? Longer.  No prior history of gout. Denies injury to L wrist. Recent UTI infection.  C/o pain at rest and worse with movement.  Past Medical History  Diagnosis Date  . Incontinence of urine   . Diverticulitis   . Fracture of tibial plateau, closed 07/14/2012  . Osteoarthritis     Past Surgical History  Procedure Laterality Date  . Colon surgery      Part  of colon removed  . Fracture surgery    . Appendectomy      History reviewed. No pertinent family history.  Social History:  reports that she quit smoking about 43 years ago. She has never used smokeless tobacco. She reports that she does not drink alcohol or use illicit drugs.  Allergies: No Known Allergies  Medications: I have reviewed the patient's current medications.  Results for orders placed during the hospital encounter of 03/01/13 (from the past 48 hour(s))  CBC WITH DIFFERENTIAL     Status: Abnormal   Collection Time    03/02/13  2:05 AM      Result Value Range   WBC 13.4 (*) 4.0 - 10.5 K/uL   RBC 3.53 (*) 3.87 - 5.11 MIL/uL   Hemoglobin 11.0 (*) 12.0 - 15.0 g/dL   HCT 16.1 (*) 09.6 - 04.5 %   MCV 89.5  78.0 - 100.0 fL   MCH 31.2  26.0 - 34.0 pg   MCHC 34.8  30.0 - 36.0 g/dL   RDW 40.9  81.1 - 91.4 %   Platelets 138 (*) 150 - 400 K/uL   Neutrophils Relative 65  43 - 77 %   Neutro Abs 8.7 (*) 1.7 - 7.7 K/uL   Lymphocytes Relative 24  12 - 46 %   Lymphs Abs 3.2  0.7 - 4.0 K/uL   Monocytes Relative 10  3 - 12 %   Monocytes Absolute 1.3 (*) 0.1 - 1.0 K/uL   Eosinophils Relative 2  0 - 5 %   Eosinophils Absolute 0.2  0.0 - 0.7 K/uL   Basophils Relative 0  0 - 1 %   Basophils Absolute 0.0  0.0 - 0.1 K/uL  BASIC METABOLIC PANEL     Status: Abnormal   Collection Time    03/02/13  2:05 AM      Result Value Range   Sodium 135  135 - 145 mEq/L   Potassium 3.7  3.5 - 5.1 mEq/L   Chloride 99  96 - 112 mEq/L   CO2 26  19 - 32 mEq/L   Glucose, Bld 104 (*) 70 - 99 mg/dL   BUN 11  6 - 23 mg/dL   Creatinine, Ser 7.82  0.50 - 1.10 mg/dL   Calcium 9.6  8.4 - 95.6 mg/dL   GFR calc non Af Amer 78 (*) >90 mL/min   GFR calc Af Amer >90  >90 mL/min   Comment:            The eGFR has been calculated     using the CKD EPI equation.     This calculation has not been     validated in all clinical     situations.     eGFR's persistently     <90 mL/min signify     possible Chronic Kidney  Disease.  CELL COUNT + DIFF,  W/ CRYST-SYNVL FLD     Status: Abnormal   Collection Time    03/02/13  4:05 AM      Result Value Range   Color, Synovial RED (*) YELLOW   Appearance-Synovial CLOUDY (*) CLEAR   Crystals, Fluid       Value: SPECIMEN/CONTAINER TYPE INAPPROPRIATE FOR ORDERED TEST, UNABLE TO PERFORM   WBC, Synovial SPECIMEN CLOTTED, UNABLE TO PERFORM CELL COUNT  0 - 200 /cu mm   Neutrophil, Synovial 90 (*) 0 - 25 %   Lymphocytes-Synovial Fld 7  0 - 20 %   Monocyte-Macrophage-Synovial Fluid 2 (*) 50 - 90 %   Eosinophils-Synovial 1  0 - 1 %   Other Cells-SYN SPECIMEN CLOTTED, UNABLE TO PERFORM CELL COUNT      Dg Wrist Complete Left  03/02/2013  *RADIOLOGY REPORT*  Clinical Data: Pain and swelling of the dorsal aspect of the left wrist.  LEFT WRIST - COMPLETE 3+ VIEW  Comparison: None.  Findings: The patient has extensive chondrocalcinosis in the radiocarpal joint as well as at the first carpal metacarpal joint consistent with CPPD arthritis.  No fractures.  IMPRESSION: CPPD arthritis.   Original Report Authenticated By: Francene Boyers, M.D.    Dg Hand Complete Left  03/02/2013  *RADIOLOGY REPORT*  Clinical Data: Pain and swelling of the left hand.  LEFT HAND - COMPLETE 3+ VIEW  Comparison: None.  Findings: Diffuse osteoarthritis of the interphalangeal joints and of the metacarpal phalangeal joints.  There is  chondrocalcinosis in the radiocarpal joint and at the first carpal metacarpal joint.  No fracture or dislocation.  IMPRESSION: Diffuse arthritic changes.  No acute abnormalities.   Original Report Authenticated By: Francene Boyers, M.D.     Pertinent items are noted in HPI. Temp:  [98.1 F (36.7 C)] 98.1 F (36.7 C) (04/18 0010) Pulse Rate:  [74] 74 (04/18 0010) Resp:  [20] 20 (04/18 0010) BP: (145)/(72) 145/72 mmHg (04/18 0010) SpO2:  [98 %] 98 % (04/18 0010) Weight:  [50.803 kg (112 lb)] 50.803 kg (112 lb) (04/18 0010) General appearance: alert and cooperative Resp: clear to auscultation bilaterally Cardio: regular rate and rhythm GI: soft, non-tender; bowel sounds normal; no masses,  no organomegaly Extremities: extremities normal, atraumatic, no cyanosis or edema and edema except for edema, sl erythema of L wrist, pain with passive and active motion of wrist, no obvious sign of truama to external skin, grossly n/v intact distally   Assessment/Plan: Pain, swelling L wrist, increased wbc - concern for infected wrist joint. Plan: Wrist aspirated small amount of fluid obtained, will send for analysis; cont treatment with abx, splint for wrist;  Will f/u with results.  Ritchie Klee CHRISTOPHER 03/02/2013, 7:21 AM

## 2013-03-02 NOTE — Care Management Note (Signed)
    Page 1 of 1   03/02/2013     12:23:25 PM   CARE MANAGEMENT NOTE 03/02/2013  Patient:  Cathy Blackwell, Cathy Blackwell   Account Number:  [id number]  Date Initiated:  03/02/2013  Documentation initiated by:  Lorenda Ishihara  Subjective/Objective Assessment:   79 yo female admitted with possible septic arthritis. PTA live at SNF.     Action/Plan:   Return to SNF when stable.   Anticipated DC Date:  03/05/2013   Anticipated DC Plan:  SKILLED NURSING FACILITY  In-house referral  Clinical Social Worker      DC Planning Services  CM consult      Choice offered to / List presented to:             Status of service:  Completed, signed off Medicare Important Message given?   (If response is "NO", the following Medicare IM given date fields will be blank) Date Medicare IM given:   Date Additional Medicare IM given:    Discharge Disposition:  SKILLED NURSING FACILITY  Per UR Regulation:  Reviewed for med. necessity/level of care/duration of stay  If discussed at Long Length of Stay Meetings, dates discussed:    Comments:

## 2013-03-02 NOTE — Transfer of Care (Signed)
Immediate Anesthesia Transfer of Care Note  Patient: Cathy Blackwell  Procedure(s) Performed: Procedure(s) with comments: IRRIGATION AND DEBRIDEMENT EXTREMITY (Left) - liter saline 14 g angio cath  Patient Location: PACU  Anesthesia Type:General  Level of Consciousness: awake, confused and responds to stimulation  Airway & Oxygen Therapy: Patient Spontanous Breathing and Patient connected to face mask oxygen  Post-op Assessment: Report given to PACU RN, Post -op Vital signs reviewed and stable and Patient moving all extremities X 4  Post vital signs: stable  Complications: No apparent anesthesia complications

## 2013-03-02 NOTE — ED Provider Notes (Signed)
Medical screening examination/treatment/procedure(s) were conducted as a shared visit with non-physician practitioner(s) and myself. I personally evaluated the patient during the encounter   ARTHOCENTESIS Date/Time: 03/02/2013 5:10 AM  Performed by: Lyanne Co  Authorized by: Lyanne Co  Consent: Verbal consent obtained. Risks and benefits: risks, benefits and alternatives were discussed Consent given by: patient  Required items: required blood products, implants, devices, and special equipment available  Patient identity confirmed: verbally with patient  Time out: Immediately prior to procedure a "time out" was called to verify the correct patient, procedure, equipment, support staff and site/side marked as required. Indications: joint swelling, pain and possible septic joint  Body area: wrist  Joint: left wrist  Local anesthesia used: yes Local anesthetic: lidocaine 2% without epinephrine Anesthetic total: 3 ml Patient sedated: no Preparation: Patient was prepped and draped in the usual sterile fashion. Needle gauge: 20 G  Approach: posterior Aspirate: bloody Aspirate amount: 0.5 ml Patient tolerance: Patient tolerated the procedure well with no immediate complications. Unfortunately a small amount of synovial fluid that was aspirated clotted. I was unable to get a culture, cell count, Gram stain. This may represent crystal-induced arthritis however it's difficult to exclude septic arthritis. The patient was started on IV antibiotics. I spoke with the hand surgeon Dr. Izora Ribas who will see the patient in the hospital. The patient was admitted to the hospitalist service for ongoing evaluation and treatment.  Lyanne Co, MD  03/02/13 1610   Lyanne Co, MD 03/02/13 [phone]

## 2013-03-02 NOTE — ED Notes (Signed)
Bed:WA07<BR> Expected date:<BR> Expected time:<BR> Means of arrival:<BR> Comments:<BR>

## 2013-03-02 NOTE — ED Notes (Addendum)
Pt alert and oriented. Respirations even and unlabored. Bilateral rise and fall of chest. Skin warm and dry. In no acute distress. Denies needs. Delay explained.

## 2013-03-02 NOTE — Progress Notes (Signed)
[facility] PROGRESS NOTE  AHLIA LEMANSKI MVH:[id number] DOB: 01/25/1919 DOA: 03/01/2013 PCP: No primary provider on file.  Assessment/Plan: 1. Left wrist pain and swelling differentials include gouty arthritis versus septic arthritis - continue with empiric antibiotics vancomycin and ceftriaxone and the pain relief medications. Hand surgeon Dr. Izora Ribas to see.  2. Depression - continue home medications. 3. Leukocytosis - probably from #1 reason. 4.   Code Status: full code Family Communication: family at bedside Disposition Plan: pending.    Consultants:  Hand surgeon  Procedures: Aspiration of the left wrist. Antibiotics:  Vancomycin   Rocephin 4/18  HPI/Subjective: Requesting to eat. Pain is controlled.   Objective: Filed Vitals:   03/02/13 1500 03/02/13 1515 03/02/13 1520 03/02/13 1538  BP: 141/56 136/84  150/67  Pulse: 87 66 83   Temp: 97.9 F (36.6 C)  97.4 F (36.3 C) 98.4 F (36.9 C)  TempSrc:      Resp: 21 15 18 16   Height:      Weight:      SpO2: 100% 96% 92% 96%    Intake/Output Summary (Last 24 hours) at 03/02/13 1555 Last data filed at 03/02/13 1528  Gross per 24 hour  Intake    750 ml  Output      0 ml  Net    750 ml   Filed Weights   03/02/13 0010  Weight: 50.803 kg (112 lb)    Exam:   General:  Alert afebrile comfortable  Cardiovascular: s1s2  Respiratory: CTAB, no wheezing or rhonchi  Abdomen: soft NT ND BS+  Musculoskeletal: LEFT WRIST SWELLING, in a splint.   Data Reviewed: Basic Metabolic Panel:  Recent Labs Lab 03/02/13 0205 03/02/13 1020  NA 135 134*  K 3.7 3.6  CL 99 98  CO2 26 27  GLUCOSE 104* 116*  BUN 11 11  CREATININE 0.59 0.54  CALCIUM 9.6 9.1   Liver Function Tests: No results found for this basename: AST, ALT, ALKPHOS, BILITOT, PROT, ALBUMIN,  in the last 168 hours No results found for this basename: LIPASE, AMYLASE,  in the last 168 hours No results found for this basename: AMMONIA,  in  the last 168 hours CBC:  Recent Labs Lab 03/02/13 0205 03/02/13 1020  WBC 13.4* 13.3*  NEUTROABS 8.7* 10.8*  HGB 11.0* 10.4*  HCT 31.6* 30.1*  MCV 89.5 91.2  PLT 138* 118*   Cardiac Enzymes: No results found for this basename: CKTOTAL, CKMB, CKMBINDEX, TROPONINI,  in the last 168 hours BNP (last 3 results) No results found for this basename: PROBNP,  in the last 8760 hours CBG: No results found for this basename: GLUCAP,  in the last 168 hours  Recent Results (from the past 240 hour(s))  BODY FLUID CULTURE     Status: None   Collection Time    03/02/13  7:40 AM      Result Value Range Status   Specimen Description SYNOVIAL LT WRIST   Final   Special Requests NONE   Final   Gram Stain     Final   Value: FEW WBC PRESENT, PREDOMINANTLY PMN     NO ORGANISMS SEEN     Gram Stain Report Called to,Read Back By and Verified With: Gram Stain Report Called to,Read Back By and Verified With: DR COLEY 03/02/2013 1:26PM BY MILSH   Culture PENDING   Incomplete   Report Status PENDING   Incomplete  SURGICAL PCR SCREEN     Status: None   Collection Time  03-31-2013 12:50 PM      Result Value Range Status   MRSA, PCR NEGATIVE  NEGATIVE Final   Staphylococcus aureus NEGATIVE  NEGATIVE Final   Comment:            The Xpert SA Assay (FDA     approved for NASAL specimens     in patients over 51 years of age),     is one component of     a comprehensive surveillance     program.  Test performance has     been validated by [employer] for patients greater     than or equal to 40 year old.     It is not intended     to diagnose infection nor to     guide or monitor treatment.     Studies: Dg Wrist Complete Left  03-31-13  *RADIOLOGY REPORT*  Clinical Data: Pain and swelling of the dorsal aspect of the left wrist.  LEFT WRIST - COMPLETE 3+ VIEW  Comparison: None.  Findings: The patient has extensive chondrocalcinosis in the radiocarpal joint as well as at the first carpal  metacarpal joint consistent with CPPD arthritis.  No fractures.  IMPRESSION: CPPD arthritis.   Original Report Authenticated By: Francene Boyers, M.D.    Dg Hand Complete Left  Mar 31, 2013  *RADIOLOGY REPORT*  Clinical Data: Pain and swelling of the left hand.  LEFT HAND - COMPLETE 3+ VIEW  Comparison: None.  Findings: Diffuse osteoarthritis of the interphalangeal joints and of the metacarpal phalangeal joints.  There is chondrocalcinosis in the radiocarpal joint and at the first carpal metacarpal joint.  No fracture or dislocation.  IMPRESSION: Diffuse arthritic changes.  No acute abnormalities.   Original Report Authenticated By: Francene Boyers, M.D.     Scheduled Meds: . [START ON 03/03/2013] cefTRIAXone (ROCEPHIN)  IV  1 g Intravenous Q24H  . mirtazapine  7.5 mg Oral QHS  . piperacillin-tazobactam (ZOSYN)  IV  3.375 g Intravenous Once  . QUEtiapine  25 mg Oral QHS  . vancomycin  750 mg Intravenous Q24H   Continuous Infusions:   Principal Problem:   Swelling of joint, wrist, left Active Problems:   Leucocytosis        Sheyenne Konz  [facility] Pager [phone]  If 7PM-7AM, please contact night-coverage at [email or web address], password [facility] 2013-03-31, 3:55 PM  LOS: 1 day

## 2013-03-02 NOTE — ED Provider Notes (Signed)
Medical screening examination/treatment/procedure(s) were conducted as a shared visit with non-physician practitioner(s) and myself.  I personally evaluated the patient during the encounter  ARTHOCENTESIS Date/Time: 03/02/2013 5:10 AM Performed by: Lyanne Co Authorized by: Lyanne Co Consent: Verbal consent obtained. Risks and benefits: risks, benefits and alternatives were discussed Consent given by: patient Required items: required blood products, implants, devices, and special equipment available Patient identity confirmed: verbally with patient Time out: Immediately prior to procedure a "time out" was called to verify the correct patient, procedure, equipment, support staff and site/side marked as required. Indications: joint swelling, pain and possible septic joint  Body area: wrist Joint: left wrist Local anesthesia used: yes Local anesthetic: lidocaine 2% without epinephrine Anesthetic total: 3 ml Patient sedated: no Preparation: Patient was prepped and draped in the usual sterile fashion. Needle gauge: 20 G Approach: posterior Aspirate: bloody Aspirate amount: 0.5 ml Patient tolerance: Patient tolerated the procedure well with no immediate complications.   Unfortunately a small amount of synovial fluid that was aspirated clotted.  I was unable to get a culture, cell count, Gram stain.  This may represent crystal-induced arthritis however it's difficult to exclude septic arthritis.  The patient was started on IV antibiotics.  I spoke with the hand surgeon Dr. Izora Ribas who will see the patient in the hospital.  The patient was admitted to the hospitalist service for ongoing evaluation and treatment.   Lyanne Co, MD 03/02/13 [phone]

## 2013-03-02 NOTE — ED Notes (Signed)
Made EDP aware of synovial specimen. No new orders at this time.

## 2013-03-02 NOTE — H&P (Signed)
[facility] History and Physical  Cathy Blackwell AVW:[id number] DOB: [birth year] DOA: 03/01/2013  Referring physician: Beatris Ship. PCP: No primary provider on file. Dr.Tripp. Specialists: None.  Chief Complaint: Left wrist pain and swelling.  HPI: Cathy Blackwell is a 79 y.o. female history of depression who lives in nursing home was brought to the ER patient was complaining of increasing pain in her left wrist since last night. Patient's daughter states that patient may have had swelling and pain in the left wrist for last 2 days which has gradually worsened. Patient did not have any fever chills or any fall or trauma. X-rays done in the ER show possibility of CPPD arthritis. Since patient has significant pain and leukocytosis at this time patient has been admitted for further management. ER physician Dr. Patria Mane tried to aspirate the joint but was not successful. Hand surgeon on call Dr. Izora Ribas was consulted and will be seeing in consult.  Review of Systems: As presented in the history of presenting illness, rest negative.  Past Medical History  Diagnosis Date  . Incontinence of urine   . Diverticulitis   . Fracture of tibial plateau, closed 07/14/2012  . Osteoarthritis    Past Surgical History  Procedure Laterality Date  . Colon surgery      Part  of colon removed  . Fracture surgery    . Appendectomy     Social History:  reports that she quit smoking about 43 years ago. She has never used smokeless tobacco. She reports that she does not drink alcohol or use illicit drugs. Lives at nursing home. where does patient live-- Not sure. Can patient participate in ADLs?  No Known Allergies  History reviewed. No pertinent family history.    Prior to Admission medications   Medication Sig Start Date End Date Taking? Authorizing Provider  aspirin 81 MG chewable tablet Chew 81 mg by mouth every morning.    Yes Historical Provider, MD  calcium-vitamin D (OSCAL WITH D) 500-200  MG-UNIT per tablet Take 1 tablet by mouth every morning.    Yes Historical Provider, MD  mirtazapine (REMERON) 7.5 MG tablet Take 7.5 mg by mouth at bedtime.   Yes Historical Provider, MD  QUEtiapine (SEROQUEL) 25 MG tablet Take 25 mg by mouth at bedtime.   Yes Historical Provider, MD   Physical Exam: Filed Vitals:   03/02/13 0010  BP: 145/72  Pulse: 74  Temp: 98.1 F (36.7 C)  TempSrc: Oral  Resp: 20  Height: 5\' 2"  (1.575 m)  Weight: 50.803 kg (112 lb)  SpO2: 98%     General:  Well-developed well-nourished.  Eyes: Anicteric no pallor.  ENT: No discharge from the ears eyes nose and mouth.  Neck: No mass felt.  Cardiovascular: S1-S2 heard.  Respiratory: No rhonchi or crepitations.  Abdomen: Soft nontender bowel sounds present.  Skin: Mild erythema in the left wrist.  Musculoskeletal: Left wrist is swollen and tender and very painful on moving.  Psychiatric: Appears normal.  Neurologic: Follows commands.  Labs on Admission:  Basic Metabolic Panel:  Recent Labs Lab 03/02/13 0205  NA 135  K 3.7  CL 99  CO2 26  GLUCOSE 104*  BUN 11  CREATININE 0.59  CALCIUM 9.6   Liver Function Tests: No results found for this basename: AST, ALT, ALKPHOS, BILITOT, PROT, ALBUMIN,  in the last 168 hours No results found for this basename: LIPASE, AMYLASE,  in the last 168 hours No results found for this basename: AMMONIA,  in the last  168 hours CBC:  Recent Labs Lab 03/02/13 0205  WBC 13.4*  NEUTROABS 8.7*  HGB 11.0*  HCT 31.6*  MCV 89.5  PLT 138*   Cardiac Enzymes: No results found for this basename: CKTOTAL, CKMB, CKMBINDEX, TROPONINI,  in the last 168 hours  BNP (last 3 results) No results found for this basename: PROBNP,  in the last 8760 hours CBG: No results found for this basename: GLUCAP,  in the last 168 hours  Radiological Exams on Admission: Dg Wrist Complete Left  03/02/2013  *RADIOLOGY REPORT*  Clinical Data: Pain and swelling of the dorsal  aspect of the left wrist.  LEFT WRIST - COMPLETE 3+ VIEW  Comparison: None.  Findings: The patient has extensive chondrocalcinosis in the radiocarpal joint as well as at the first carpal metacarpal joint consistent with CPPD arthritis.  No fractures.  IMPRESSION: CPPD arthritis.   Original Report Authenticated By: Francene Boyers, M.D.    Dg Hand Complete Left  03/02/2013  *RADIOLOGY REPORT*  Clinical Data: Pain and swelling of the left hand.  LEFT HAND - COMPLETE 3+ VIEW  Comparison: None.  Findings: Diffuse osteoarthritis of the interphalangeal joints and of the metacarpal phalangeal joints.  There is chondrocalcinosis in the radiocarpal joint and at the first carpal metacarpal joint.  No fracture or dislocation.  IMPRESSION: Diffuse arthritic changes.  No acute abnormalities.   Original Report Authenticated By: Francene Boyers, M.D.      Assessment/Plan Principal Problem:   Swelling of joint, wrist, left Active Problems:   Leucocytosis   1. Left wrist pain and swelling differentials include gouty arthritis versus septic arthritis - continue with empiric antibiotics vancomycin and ceftriaxone and the pain relief medications. Hand surgeon Dr. Izora Ribas to see. Until then patient will be kept n.p.o. Check uric acid levels. 2. Depression - continue home medications. 3. Leukocytosis - probably from #1 reason.    Code Status: Full code.  Family Communication: Patient's daughter at the bedside. Patient's daughter is also the healthcare power of attorney. Disposition Plan: Admit to inpatient.    KAKRAKANDY,ARSHAD N. [facility] Pager [phone].  If 7PM-7AM, please contact night-coverage [email or web address] Password [facility] 03/02/2013, 5:28 AM

## 2013-03-02 NOTE — Anesthesia Postprocedure Evaluation (Signed)
  Anesthesia Post-op Note  Patient: Cathy Blackwell  Procedure(s) Performed: Procedure(s) (LRB): IRRIGATION AND DEBRIDEMENT EXTREMITY (Left)  Patient Location: PACU  Anesthesia Type: General  Level of Consciousness: awake and confused  Airway and Oxygen Therapy: Patient Spontanous Breathing  Post-op Pain: mild  Post-op Assessment: Post-op Vital signs reviewed, Patient's Cardiovascular Status Stable, Respiratory Function Stable, Patent Airway and No signs of Nausea or vomiting  Last Vitals:  Filed Vitals:   03/02/13 1500  BP: 141/56  Pulse:   Temp: 36.6 C  Resp:     Post-op Vital Signs: stable   Complications: No apparent anesthesia complications

## 2013-03-02 NOTE — Anesthesia Preprocedure Evaluation (Signed)
Anesthesia Evaluation  Patient identified by MRN, date of birth, ID band Patient awake    Reviewed: Allergy & Precautions, H&P , NPO status , Patient's Chart, lab work & pertinent test results  Airway Mallampati: II  TM Distance: >3 FB Neck ROM: Full    Dental no notable dental hx.    Pulmonary neg pulmonary ROS,  breath sounds clear to auscultation  Pulmonary exam normal       Cardiovascular negative cardio ROS  Rhythm:Regular Rate:Normal     Neuro/Psych negative neurological ROS  negative psych ROS   GI/Hepatic negative GI ROS, Neg liver ROS,   Endo/Other  negative endocrine ROS  Renal/GU negative Renal ROS  negative genitourinary   Musculoskeletal negative musculoskeletal ROS (+)   Abdominal   Peds negative pediatric ROS (+)  Hematology negative hematology ROS (+)   Anesthesia Other Findings   Reproductive/Obstetrics negative OB ROS                             Anesthesia Physical Anesthesia Plan  ASA: II and emergent  Anesthesia Plan: General   Post-op Pain Management:    Induction: Intravenous  Airway Management Planned: LMA  Additional Equipment:   Intra-op Plan:   Post-operative Plan: Extubation in OR  Informed Consent: I have reviewed the patients History and Physical, chart, labs and discussed the procedure including the risks, benefits and alternatives for the proposed anesthesia with the patient or authorized representative who has indicated his/her understanding and acceptance.   Dental advisory given  Plan Discussed with: CRNA  Anesthesia Plan Comments:         Anesthesia Quick Evaluation  

## 2013-03-02 NOTE — ED Provider Notes (Signed)
History     CSN: [id number]  Arrival date & time 03/01/13  2343   First MD Initiated Contact with Patient 03/02/13 0020      No chief complaint on file.   (Consider location/radiation/quality/duration/timing/severity/associated sxs/prior treatment) HPI Patient is a 79 y.o female presenting with swollen, painful left wrist.  Patient denies falling and states her wrist started hurting today for "no reason."  She rates the pain 8/10 and reports very limited range of motion.  Patient lives at assisted care facility and was brought in by her daughter and son-in-law.  She denies fever.  Patient has had numerous falls in the past.  Patient has osteoarthritis but no history of rheumatoid disease.  Past Medical History  Diagnosis Date  . Incontinence of urine   . Diverticulitis   . Fracture of tibial plateau, closed 07/14/2012  . Osteoarthritis     Past Surgical History  Procedure Laterality Date  . Colon surgery      Part  of colon removed  . Fracture surgery    . Appendectomy      No family history on file.  History  Substance Use Topics  . Smoking status: Former Smoker -- 10 years    Quit date: 07/13/1969  . Smokeless tobacco: Never Used  . Alcohol Use: No     Comment: quit drinking in late 1970's - "was a heavy drinker at one time"    OB History   Grav Para Term Preterm Abortions TAB SAB Ect Mult Living                  Review of Systems  Constitutional: Negative for fever.  Respiratory: Negative for cough, chest tightness and shortness of breath.   Cardiovascular: Negative for chest pain.  Musculoskeletal: Positive for joint swelling.  Neurological: Negative for dizziness, light-headedness and numbness.    Allergies  Review of patient's allergies indicates no known allergies.  Home Medications   Current Outpatient Rx  Name  Route  Sig  Dispense  Refill  . aspirin 81 MG chewable tablet   Oral   Chew 81 mg by mouth every morning.          .  calcium-vitamin D (OSCAL WITH D) 500-200 MG-UNIT per tablet   Oral   Take 1 tablet by mouth every morning.          . mirtazapine (REMERON) 7.5 MG tablet   Oral   Take 7.5 mg by mouth at bedtime.         Marland Kitchen QUEtiapine (SEROQUEL) 25 MG tablet   Oral   Take 25 mg by mouth at bedtime.           BP 145/72  Pulse 74  Temp(Src) 98.1 F (36.7 C) (Oral)  Resp 20  Ht 5\' 2"  (1.575 m)  Wt 112 lb (50.803 kg)  BMI 20.48 kg/m2  SpO2 98%  Physical Exam  Constitutional: She appears well-developed and well-nourished. No distress.  HENT:  Head: Normocephalic and atraumatic.  Cardiovascular: Normal rate, regular rhythm and normal heart sounds.   Pulmonary/Chest: Effort normal.  Musculoskeletal:       Left elbow: Normal.       Left wrist: She exhibits decreased range of motion, tenderness, bony tenderness and swelling. She exhibits no crepitus, no deformity and no laceration.  Skin: Skin is warm and dry. No rash noted. There is erythema.    ED Course  Procedures (including critical care time)  Labs Reviewed - No data to display  Dg Wrist Complete Left  03/02/2013  *RADIOLOGY REPORT*  Clinical Data: Pain and swelling of the dorsal aspect of the left wrist.  LEFT WRIST - COMPLETE 3+ VIEW  Comparison: None.  Findings: The patient has extensive chondrocalcinosis in the radiocarpal joint as well as at the first carpal metacarpal joint consistent with CPPD arthritis.  No fractures.  IMPRESSION: CPPD arthritis.   Original Report Authenticated By: Francene Boyers, M.D.    Dg Hand Complete Left  03/02/2013  *RADIOLOGY REPORT*  Clinical Data: Pain and swelling of the left hand.  LEFT HAND - COMPLETE 3+ VIEW  Comparison: None.  Findings: Diffuse osteoarthritis of the interphalangeal joints and of the metacarpal phalangeal joints.  There is chondrocalcinosis in the radiocarpal joint and at the first carpal metacarpal joint.  No fracture or dislocation.  IMPRESSION: Diffuse arthritic changes.  No acute  abnormalities.   Original Report Authenticated By: Francene Boyers, M.D.     Patient's wrist is warm and swollen with mild redness.  There is no wound to the skin.  Patient will be assessed for joint infection versus gout   MDM  MDM Reviewed: nursing note and vitals Interpretation: labs and x-ray           Carlyle Dolly, PA-C 03/02/13 0217

## 2013-03-02 NOTE — Progress Notes (Signed)
CSW attempted to complete psychosocial assessment. Per chart review patient is admitted from an assisted living facility. CSW will inform Unit CSW regarding patient current csw needs.   Catha Gosselin, LCSWA  [phone] .03/02/2013 10:02am

## 2013-03-02 NOTE — Progress Notes (Signed)
INITIAL NUTRITION ASSESSMENT  DOCUMENTATION CODES Per approved criteria  -Not Applicable   INTERVENTION: Provide Ensure Complete BID in between meals Provide Multivitamin with minerals daily  NUTRITION DIAGNOSIS: Inadequate oral intake related to age and poor appetite as evidenced by pt refusing meals.   Goal: Pt to meet >/= 90% of their estimated nutrition needs  Monitor:  PO intake Wt  Reason for Assessment: MST  79 y.o. female  Admitting Dx: Swelling of joint, wrist, left  ASSESSMENT: 79 y.o. female history of depression who lives in nursing home was brought to the ER patient was complaining of increasing pain in her left wrist since last night. Patient's daughter states that patient may have had swelling and pain in the left wrist for last 2 days which has gradually worsened.  Pt reports that he appetite is normal and that she was eating 3 meals daily PTA. Per daughter in the room pt's appetite is poor and she eats very little at meal times. RN reports that pt refused to eat one meal today. Pt denies wt loss stating she usually weighs 112 lbs.  Height: Ht Readings from Last 1 Encounters:  03/02/13 5\' 2"  (1.575 m)    Weight: Wt Readings from Last 1 Encounters:  03/02/13 112 lb (50.803 kg)    Ideal Body Weight: 110 lbs  % Ideal Body Weight: 102%  Wt Readings from Last 10 Encounters:  03/02/13 112 lb (50.803 kg)  03/02/13 112 lb (50.803 kg)  07/13/12 117 lb 8.1 oz (53.3 kg)    Usual Body Weight: 112 lbs  % Usual Body Weight: 100%  BMI:  Body mass index is 20.48 kg/(m^2).  Estimated Nutritional Needs: Kcal: 1200-1400 Protein: 60-71 grams Fluid: 1.5-1.9 L  Skin: incision on arm  Diet Order: General  EDUCATION NEEDS: -No education needs identified at this time   Intake/Output Summary (Last 24 hours) at 03/02/13 1636 Last data filed at 03/02/13 1528  Gross per 24 hour  Intake    750 ml  Output      0 ml  Net    750 ml    Last BM:  PTA  Labs:   Recent Labs Lab 03/02/13 0205 03/02/13 1020  NA 135 134*  K 3.7 3.6  CL 99 98  CO2 26 27  BUN 11 11  CREATININE 0.59 0.54  CALCIUM 9.6 9.1  GLUCOSE 104* 116*    CBG (last 3)   Recent Labs  03/02/13 1158  GLUCAP 126*    Scheduled Meds: . [START ON 03/03/2013] cefTRIAXone (ROCEPHIN)  IV  1 g Intravenous Q24H  . mirtazapine  7.5 mg Oral QHS  . piperacillin-tazobactam (ZOSYN)  IV  3.375 g Intravenous Once  . QUEtiapine  25 mg Oral QHS  . vancomycin  750 mg Intravenous Q24H    Continuous Infusions:   Past Medical History  Diagnosis Date  . Incontinence of urine   . Diverticulitis   . Fracture of tibial plateau, closed 07/14/2012  . Osteoarthritis     Past Surgical History  Procedure Laterality Date  . Colon surgery      Part  of colon removed  . Appendectomy      Ian Malkin RD, LDN Inpatient Clinical Dietitian Pager: [phone] After Hours Pager: [phone]

## 2013-03-02 NOTE — ED Notes (Addendum)
Per lab: Clotted results, need to be in a red top tube for synovial, maybe possible for gram stain if not stat.

## 2013-03-03 DIAGNOSIS — M25439 Effusion, unspecified wrist: Secondary | ICD-10-CM

## 2013-03-03 LAB — BASIC METABOLIC PANEL
BUN: 12 mg/dL (ref 6–23)
CO2: 29 mEq/L (ref 19–32)
Calcium: 8.5 mg/dL (ref 8.4–10.5)
Creatinine, Ser: 0.72 mg/dL (ref 0.50–1.10)

## 2013-03-03 LAB — CBC
HCT: 26.1 % — ABNORMAL LOW (ref 36.0–46.0)
MCHC: 34.5 g/dL (ref 30.0–36.0)
MCV: 91.3 fL (ref 78.0–100.0)
Platelets: 110 10*3/uL — ABNORMAL LOW (ref 150–400)
RDW: 13.9 % (ref 11.5–15.5)
WBC: 10.5 10*3/uL (ref 4.0–10.5)

## 2013-03-03 LAB — GLUCOSE, CAPILLARY
Glucose-Capillary: 101 mg/dL — ABNORMAL HIGH (ref 70–99)
Glucose-Capillary: 117 mg/dL — ABNORMAL HIGH (ref 70–99)
Glucose-Capillary: 127 mg/dL — ABNORMAL HIGH (ref 70–99)

## 2013-03-03 MED ORDER — IBUPROFEN 800 MG PO TABS
400.0000 mg | ORAL_TABLET | Freq: Four times a day (QID) | ORAL | Status: DC | PRN
  Administered 2013-03-04: 400 mg via ORAL
  Filled 2013-03-03: qty 1

## 2013-03-03 NOTE — Progress Notes (Signed)
S:  C/o wrist hurts, fingers swollen.  O:Blood pressure 112/69, pulse 80, temperature 99.5 F (37.5 C), temperature source Oral, resp. rate 18, height 5\' 2"  (1.575 m), weight 50.803 kg (112 lb), SpO2 98.00%. Results for orders placed during the hospital encounter of 03/01/13  BODY FLUID CULTURE     Status: None   Collection Time    03/02/13  7:40 AM      Result Value Range Status   Specimen Description SYNOVIAL LT WRIST   Final   Special Requests NONE   Final   Gram Stain     Final   Value: FEW WBC PRESENT, PREDOMINANTLY PMN     NO ORGANISMS SEEN     Gram Stain Report Called to,Read Back By and Verified With: Gram Stain Report Called to,Read Back By and Verified With: DR Marybel Alcott 03/02/2013 1:26PM BY MILSH   Culture PENDING   Incomplete   Report Status PENDING   Incomplete  SURGICAL PCR SCREEN     Status: None   Collection Time    03/02/13 12:50 PM      Result Value Range Status   MRSA, PCR NEGATIVE  NEGATIVE Final   Staphylococcus aureus NEGATIVE  NEGATIVE Final   Comment:            The Xpert SA Assay (FDA     approved for NASAL specimens     in patients over 64 years of age),     is one component of     a comprehensive surveillance     program.  Test performance has     been validated by [employer] for patients greater     than or equal to 42 year old.     It is not intended     to diagnose infection nor to     guide or monitor treatment.   L wrist, fingers swollen (extremity has not been elevated), hurts to move fingers, puncture sites for wash-out look good, minimal erythema  A:s/p wrist joint wash out   P:wrist splint re-applied, encouraged to elevate, move fingers; wbc decreased, cx's pending, would cont abx, ? Add anti-inflammatory for some additional edema reduction/pain control

## 2013-03-03 NOTE — Progress Notes (Signed)
Patient not cooperative with phlebotomist when awoken for AM labs.  Patient was cooperative earlier in the shift when awake.  Labs were rescheduled for 0800.

## 2013-03-03 NOTE — Op Note (Signed)
NAMEJAMILYN, Cathy Blackwell            ACCOUNT NO.:  [id number]  MEDICAL RECORD NO.:  [id number]  LOCATION:  1540                         FACILITY:  [facility]  PHYSICIAN:  Johnette Abraham, MD    DATE OF BIRTH:  03/26/1919  DATE OF PROCEDURE:  03/02/2013 DATE OF DISCHARGE:                              OPERATIVE REPORT   PREOPERATIVE DIAGNOSIS:  Suspected septic arthritis of the left wrist joint.  POSTOPERATIVE DIAGNOSIS:  Suspected septic arthritis of the left wrist joint.  PROCEDURE:  Arthrotomy and irrigation of the left wrist joint.  ANESTHESIA:  General.  INDICATIONS:  Ms. Seneca is a pleasant lady who presented to ER early this morning with a swollen and very painful wrist especially with motion.  She has previously had a UTI and over the last couple days, her wrist is becoming increasingly more sore, swollen.  On evaluation, she had signs and symptoms suggestive of septic joint including a white blood cell count of 13,000.  Aspiration of the wrist joint in the emergency department revealed cell count of nearly 10,000.  Risk, benefits and alternatives were discussed with the patient and the patient's daughter in terms of watching and waiting versus proceeding with joint washout and I felt that it was in her best interest for early intervention.  Consent was obtained for arthrotomy and wrist irrigation.  PROCEDURE:  The patient was taken to the operating room and placed supine on the operating room table.  Extremities were padded.  General anesthesia was administered without difficulty.  Time-out was performed. The left wrist was prepped and draped in normal sterile fashion.  With in-line traction on the wrist just above the ulnar styloid, the joint was accessed easily with a 14-gauge angiocatheter for the second irrigation portal.  The interval between the third and fourth dorsal compartment was used, again in-line traction was used.  This part was a little bit more difficult  to access due to the extreme osteoarthritis of the wrist in general.  However, with irrigation of the ulnar portal, there was confirmation of fluid throughout the second.  Irrigation was performed for approximately 250 mL of fluid.  The fluid appearance was initially little cloudy; however, clear.  Afterwards, approximately 10 mL of 0.25% plain Marcaine were infiltrated around the port site and into the joint itself.  The sterile dressing was applied.  The patient tolerated the procedure well, was taken to the recovery room in stable condition.     Johnette Abraham, MD     HCC/MEDQ  D:  03/02/2013  T:  03/03/2013  Job:  [id number]

## 2013-03-03 NOTE — Clinical Social Work Psychosocial (Signed)
     Clinical Social Work Department BRIEF PSYCHOSOCIAL ASSESSMENT 03/03/2013  Patient:  Cathy Blackwell, Cathy Blackwell     Account Number:  [id number]     Admit date:  03/01/2013  Clinical Social Worker:  Doree Albee  Date/Time:  03/03/2013 12:00 M  Referred by:  RN  Date Referred:  03/03/2013 Referred for  ALF Placement   Other Referral:   Interview type:  Patient Other interview type:   patient grandson in room and patieng daughter Cathy Blackwell by phone    PSYCHOSOCIAL DATA Living Status:  FACILITY Admitted from facility:  [facility] [address] Level of care:  Assisted Living Primary support name:  Cathy Blackwell Primary support relationship to patient:  CHILD, ADULT Degree of support available:   strong    CURRENT CONCERNS Current Concerns  Post-Acute Placement   Other Concerns:    SOCIAL WORK ASSESSMENT / PLAN CSW met with pt and patient grandson at bedside to complete psychosocial assessmnet. Patient is alert and oriented to self. Patient grandson assisted with psychosocial assessment. CSW also spoke with pt daughter Cathy Blackwell by phone to complete assessment.    Pt daughter is pt HCPOA, and confirmed that patient is a current resident at [facility]. Patient has been a resident there for 3 months. Per patient daughter, patient walks with a walker and has a walker at [facility]. Per pt daughter, patient plan to return to [facility] assisted living when medically stable.    CSW will complete FL2 for MD signature and will place in pt shadow chart when completed.   Assessment/plan status:  Psychosocial Support/Ongoing Assessment of Needs Other assessment/ plan:   Information/referral to community resources:   none identified at this time    PATIENTS/FAMILYS RESPONSE TO PLAN OF CARE: Patient and patient family thanked csw for concern and support. Pt plans to return to [facility] when medically stable.

## 2013-03-03 NOTE — Progress Notes (Signed)
[facility] PROGRESS NOTE  JANNINE ABREU ZOX:[id number] DOB: 11-Jun-1919 DOA: 03/01/2013 PCP: No primary provider on file.  Assessment/Plan: Left wrist pain and swelling possibly septic arthritis - continue with empiric antibiotics vancomycin and ceftriaxone and the pain relief medications. Hand surgeon Dr. Izora Ribas on board. S/p Arthrotomy and irrigation of the left wrist joint. Cultures from the wrist joint pending.   1. Depression - continue home medications. 2. Leukocytosis - probably from #1 reason. 3. DVT prophylaxis  Code Status: full code Family Communication: family at bedside Disposition Plan: pending.    Consultants:  Hand surgeon  Procedures: Aspiration of the left wrist. Antibiotics:  Vancomycin   Rocephin 4/18  HPI/Subjective: Requesting to eat. Pain is controlled.   Objective: Filed Vitals:   03/02/13 1838 03/02/13 2141 03/03/13 0113 03/03/13 0501  BP: 142/70 122/69 101/65 112/69  Pulse: 69 66 79 80  Temp: 98 F (36.7 C) 98.8 F (37.1 C) 99 F (37.2 C) 99.5 F (37.5 C)  TempSrc: Axillary Axillary Oral Oral  Resp: 18 18 16 18   Height:      Weight:      SpO2: 98% 98% 98% 98%    Intake/Output Summary (Last 24 hours) at 03/03/13 1359 Last data filed at 03/03/13 0500  Gross per 24 hour  Intake   1035 ml  Output    202 ml  Net    833 ml   Filed Weights   03/02/13 0010  Weight: 50.803 kg (112 lb)    Exam:   General:  Alert afebrile comfortable  Cardiovascular: s1s2  Respiratory: CTAB, no wheezing or rhonchi  Abdomen: soft NT ND BS+  Musculoskeletal: LEFT WRIST SWELLING, in a splint.   Data Reviewed: Basic Metabolic Panel:  Recent Labs Lab 03/02/13 0205 03/02/13 1020 03/03/13 0805  NA 135 134* 134*  K 3.7 3.6 3.2*  CL 99 98 99  CO2 26 27 29   GLUCOSE 104* 116* 122*  BUN 11 11 12   CREATININE 0.59 0.54 0.72  CALCIUM 9.6 9.1 8.5   Liver Function Tests: No results found for this basename: AST, ALT, ALKPHOS, BILITOT,  PROT, ALBUMIN,  in the last 168 hours No results found for this basename: LIPASE, AMYLASE,  in the last 168 hours No results found for this basename: AMMONIA,  in the last 168 hours CBC:  Recent Labs Lab 03/02/13 0205 03/02/13 1020 03/03/13 0805  WBC 13.4* 13.3* 10.5  NEUTROABS 8.7* 10.8*  --   HGB 11.0* 10.4* 9.0*  HCT 31.6* 30.1* 26.1*  MCV 89.5 91.2 91.3  PLT 138* 118* 110*   Cardiac Enzymes: No results found for this basename: CKTOTAL, CKMB, CKMBINDEX, TROPONINI,  in the last 168 hours BNP (last 3 results) No results found for this basename: PROBNP,  in the last 8760 hours CBG:  Recent Labs Lab 03/02/13 1158 03/02/13 1747 03/03/13 0109 03/03/13 1157  GLUCAP 126* 116* 101* 117*    Recent Results (from the past 240 hour(s))  BODY FLUID CULTURE     Status: None   Collection Time    03/02/13  7:40 AM      Result Value Range Status   Specimen Description SYNOVIAL LT WRIST   Final   Special Requests NONE   Final   Gram Stain     Final   Value: FEW WBC PRESENT, PREDOMINANTLY PMN     NO ORGANISMS SEEN     Gram Stain Report Called to,Read Back By and Verified With: Gram Stain Report Called to,Read Back By and  Verified With: DR COLEY 2013-03-26 1:26PM BY MILSH   Culture PENDING   Incomplete   Report Status PENDING   Incomplete  SURGICAL PCR SCREEN     Status: None   Collection Time    03-26-13 12:50 PM      Result Value Range Status   MRSA, PCR NEGATIVE  NEGATIVE Final   Staphylococcus aureus NEGATIVE  NEGATIVE Final   Comment:            The Xpert SA Assay (FDA     approved for NASAL specimens     in patients over 65 years of age),     is one component of     a comprehensive surveillance     program.  Test performance has     been validated by [employer] for patients greater     than or equal to 7 year old.     It is not intended     to diagnose infection nor to     guide or monitor treatment.     Studies: Dg Wrist Complete Left  03-26-13   *RADIOLOGY REPORT*  Clinical Data: Pain and swelling of the dorsal aspect of the left wrist.  LEFT WRIST - COMPLETE 3+ VIEW  Comparison: None.  Findings: The patient has extensive chondrocalcinosis in the radiocarpal joint as well as at the first carpal metacarpal joint consistent with CPPD arthritis.  No fractures.  IMPRESSION: CPPD arthritis.   Original Report Authenticated By: Francene Boyers, M.D.    Dg Hand Complete Left  03-26-13  *RADIOLOGY REPORT*  Clinical Data: Pain and swelling of the left hand.  LEFT HAND - COMPLETE 3+ VIEW  Comparison: None.  Findings: Diffuse osteoarthritis of the interphalangeal joints and of the metacarpal phalangeal joints.  There is chondrocalcinosis in the radiocarpal joint and at the first carpal metacarpal joint.  No fracture or dislocation.  IMPRESSION: Diffuse arthritic changes.  No acute abnormalities.   Original Report Authenticated By: Francene Boyers, M.D.     Scheduled Meds: . cefTRIAXone (ROCEPHIN)  IV  1 g Intravenous Q24H  . feeding supplement  237 mL Oral BID BM  . mirtazapine  7.5 mg Oral QHS  . multivitamin with minerals  1 tablet Oral Daily  . QUEtiapine  25 mg Oral QHS  . vancomycin  750 mg Intravenous Q24H   Continuous Infusions:   Principal Problem:   Swelling of joint, wrist, left Active Problems:   Leucocytosis        Bekim Werntz  [facility] Pager [phone]  If 7PM-7AM, please contact night-coverage at [email or web address], password [facility] 03/03/2013, 1:59 PM  LOS: 2 days

## 2013-03-04 DIAGNOSIS — N390 Urinary tract infection, site not specified: Secondary | ICD-10-CM

## 2013-03-04 DIAGNOSIS — A499 Bacterial infection, unspecified: Secondary | ICD-10-CM

## 2013-03-04 LAB — BASIC METABOLIC PANEL
BUN: 12 mg/dL (ref 6–23)
CO2: 29 mEq/L (ref 19–32)
Chloride: 94 mEq/L — ABNORMAL LOW (ref 96–112)
GFR calc non Af Amer: 71 mL/min — ABNORMAL LOW (ref >90)
Glucose, Bld: 101 mg/dL — ABNORMAL HIGH (ref 70–99)
Potassium: 3.7 mEq/L (ref 3.5–5.1)
Sodium: 131 mEq/L — ABNORMAL LOW (ref 135–145)

## 2013-03-04 LAB — GLUCOSE, CAPILLARY
Glucose-Capillary: 102 mg/dL — ABNORMAL HIGH (ref 70–99)
Glucose-Capillary: 130 mg/dL — ABNORMAL HIGH (ref 70–99)

## 2013-03-04 NOTE — Progress Notes (Signed)
[facility] PROGRESS NOTE  Cathy Blackwell QMV:[id number] DOB: July 23, 1919 DOA: 03/01/2013 PCP: No primary provider on file.  Assessment/Plan: Left wrist pain and swelling possibly septic arthritis - continue with empiric antibiotics vancomycin and ceftriaxone and the pain relief medications. Hand surgeon Dr. Izora Ribas on board. S/p Arthrotomy and irrigation of the left wrist joint. Cultures from the wrist joint pending.   1. Depression - continue home medications. 2. Leukocytosis - probably from #1 reason. Resolved.  3. Fever of 100.4 despite being on antibiotics. Unclear etiology. UA abnormal last night and urine cultures ordered and pending. Continue to monitor. 4. Hypokalemia: replete as needed.  5. Thrombocytopenia: worsening , no evidence of any bleeding. Continue to monitor.  6. DVT prophylaxis  Code Status: full code Family Communication:none at bedside Disposition Plan: pending.    Consultants:  Hand surgeon  Procedures: Aspiration of the left wrist. Antibiotics:  Vancomycin   Rocephin 4/18  HPI/Subjective: Requesting to eat. Pain is controlled.   Objective: Filed Vitals:   03/03/13 2157 03/04/13 0601 03/04/13 0608 03/04/13 0700  BP: 125/60 107/63    Pulse: 93 83    Temp: 98.8 F (37.1 C) 100.4 F (38 C)  99.9 F (37.7 C)  TempSrc: Oral Oral  Oral  Resp: 24 20    Height:      Weight:      SpO2: 90% 86% 94%     Intake/Output Summary (Last 24 hours) at 03/04/13 1141 Last data filed at 03/04/13 1000  Gross per 24 hour  Intake    540 ml  Output    600 ml  Net    -60 ml   Filed Weights   03/02/13 0010  Weight: 50.803 kg (112 lb)    Exam:   General:  Alert afebrile comfortable  Cardiovascular: s1s2  Respiratory: CTAB, no wheezing or rhonchi  Abdomen: soft NT ND BS+  Musculoskeletal: LEFT WRIST SWELLING, in a splint.   Data Reviewed: Basic Metabolic Panel:  Recent Labs Lab 03/02/13 0205 03/02/13 1020 03/03/13 0805  NA 135 134*  134*  K 3.7 3.6 3.2*  CL 99 98 99  CO2 26 27 29   GLUCOSE 104* 116* 122*  BUN 11 11 12   CREATININE 0.59 0.54 0.72  CALCIUM 9.6 9.1 8.5   Liver Function Tests: No results found for this basename: AST, ALT, ALKPHOS, BILITOT, PROT, ALBUMIN,  in the last 168 hours No results found for this basename: LIPASE, AMYLASE,  in the last 168 hours No results found for this basename: AMMONIA,  in the last 168 hours CBC:  Recent Labs Lab 03/02/13 0205 03/02/13 1020 03/03/13 0805  WBC 13.4* 13.3* 10.5  NEUTROABS 8.7* 10.8*  --   HGB 11.0* 10.4* 9.0*  HCT 31.6* 30.1* 26.1*  MCV 89.5 91.2 91.3  PLT 138* 118* 110*   Cardiac Enzymes: No results found for this basename: CKTOTAL, CKMB, CKMBINDEX, TROPONINI,  in the last 168 hours BNP (last 3 results) No results found for this basename: PROBNP,  in the last 8760 hours CBG:  Recent Labs Lab 03/03/13 0109 03/03/13 1157 03/03/13 1748 03/04/13 0108 03/04/13 0740  GLUCAP 101* 117* 127* 102* 130*    Recent Results (from the past 240 hour(s))  BODY FLUID CULTURE     Status: None   Collection Time    03/02/13  7:40 AM      Result Value Range Status   Specimen Description SYNOVIAL LT WRIST   Final   Special Requests NONE   Final  Gram Stain     Final   Value: FEW WBC PRESENT, PREDOMINANTLY PMN     NO ORGANISMS SEEN     Gram Stain Report Called to,Read Back By and Verified With: Gram Stain Report Called to,Read Back By and Verified With: DR COLEY 03/02/2013 1:26PM BY MILSH   Culture NO GROWTH 1 DAY   Final   Report Status PENDING   Incomplete  SURGICAL PCR SCREEN     Status: None   Collection Time    03/02/13 12:50 PM      Result Value Range Status   MRSA, PCR NEGATIVE  NEGATIVE Final   Staphylococcus aureus NEGATIVE  NEGATIVE Final   Comment:            The Xpert SA Assay (FDA     approved for NASAL specimens     in patients over 44 years of age),     is one component of     a comprehensive surveillance     program.  Test  performance has     been validated by [employer] for patients greater     than or equal to 55 year old.     It is not intended     to diagnose infection nor to     guide or monitor treatment.     Studies: No results found.  Scheduled Meds: . cefTRIAXone (ROCEPHIN)  IV  1 g Intravenous Q24H  . feeding supplement  237 mL Oral BID BM  . mirtazapine  7.5 mg Oral QHS  . multivitamin with minerals  1 tablet Oral Daily  . QUEtiapine  25 mg Oral QHS  . vancomycin  750 mg Intravenous Q24H   Continuous Infusions:   Principal Problem:   Swelling of joint, wrist, left Active Problems:   Leucocytosis        [name]  [facility] Pager [phone]  If 7PM-7AM, please contact night-coverage at [email or web address], password [facility] 03/04/2013, 11:41 AM  LOS: 3 days

## 2013-03-04 NOTE — Plan of Care (Signed)
Problem: Phase II Progression Outcomes Goal: Progress activity as tolerated unless otherwise ordered Outcome: Not Progressing Order given for PT and OT eval.

## 2013-03-05 ENCOUNTER — Encounter (HOSPITAL_COMMUNITY): Payer: Self-pay | Admitting: General Surgery

## 2013-03-05 DIAGNOSIS — D72829 Elevated white blood cell count, unspecified: Secondary | ICD-10-CM

## 2013-03-05 DIAGNOSIS — M25439 Effusion, unspecified wrist: Secondary | ICD-10-CM

## 2013-03-05 LAB — BODY FLUID CULTURE

## 2013-03-05 LAB — GLUCOSE, CAPILLARY: Glucose-Capillary: 107 mg/dL — ABNORMAL HIGH (ref 70–99)

## 2013-03-05 MED ORDER — ADULT MULTIVITAMIN W/MINERALS CH: 1.0000 | ORAL_TABLET | Freq: Every day | ORAL | Status: AC

## 2013-03-05 MED ORDER — CLINDAMYCIN HCL 300 MG PO CAPS: 600.0000 mg | ORAL_CAPSULE | Freq: Three times a day (TID) | ORAL | Status: DC

## 2013-03-05 MED ORDER — IBUPROFEN 400 MG PO TABS: 400.0000 mg | ORAL_TABLET | Freq: Four times a day (QID) | ORAL | Status: DC | PRN

## 2013-03-05 MED ORDER — ENSURE COMPLETE PO LIQD: 237.0000 mL | Freq: Two times a day (BID) | ORAL | Status: AC

## 2013-03-05 NOTE — Progress Notes (Signed)
S: pt still with significant wrist pain  O:Blood pressure 115/68, pulse 67, temperature 97.6 F (36.4 C), temperature source Oral, resp. rate 16, height 5\' 2"  (1.575 m), weight 50.803 kg (112 lb), SpO2 100.00%. Results for orders placed during the hospital encounter of 03/01/13  BODY FLUID CULTURE     Status: None   Collection Time    03/02/13  7:40 AM      Result Value Range Status   Specimen Description SYNOVIAL LT WRIST   Final   Special Requests NONE   Final   Gram Stain     Final   Value: FEW WBC PRESENT, PREDOMINANTLY PMN     NO ORGANISMS SEEN     Gram Stain Report Called to,Read Back By and Verified With: Gram Stain Report Called to,Read Back By and Verified With: DR Andrey Mccaskill 03/02/2013 1:26PM BY MILSH   Culture NO GROWTH 3 DAYS   Final   Report Status 03/05/2013 FINAL   Final  SURGICAL PCR SCREEN     Status: None   Collection Time    03/02/13 12:50 PM      Result Value Range Status   MRSA, PCR NEGATIVE  NEGATIVE Final   Staphylococcus aureus NEGATIVE  NEGATIVE Final   Comment:            The Xpert SA Assay (FDA     approved for NASAL specimens     in patients over 53 years of age),     is one component of     a comprehensive surveillance     program.  Test performance has     been validated by [employer] for patients greater     than or equal to 20 year old.     It is not intended     to diagnose infection nor to     guide or monitor treatment.     L wrist: some swelling, no erythema, difficulty moving wrist, fingers due to pain    A:s/p L wrist irrigation for presumed infection - no growth to date from cultures, still with pain   P:  Would focus on an anti-inflammatory agent,  Cont a course of oral antibiotics for total of 7-10 days, cont wrist splint for comfort, elevation.  Pt can f/u in office as needed.

## 2013-03-05 NOTE — Evaluation (Signed)
Occupational Therapy Evaluation Patient Details Name: Cathy Blackwell MRN: [id number] DOB: January 26, 1919 Today's Date: 03/05/2013 Time: 7829-5621 OT Time Calculation (min): 18 min  OT Assessment / Plan / Recommendation Clinical Impression  79 yo female admitted with L wrist swelling, weakness. S/p wrist joint wash out and L wrist splint/brace. Pt will benefit from skilled OT to increase I with ADL activity and return to PLOF at ALF. Pt will need continued OT to increase function of LUE at ALF    OT Assessment  Patient needs continued OT Services    Follow Up Recommendations  SNF;Home health OT;Supervision/Assistance - 24 hour;Other (comment) (at ALF)             Frequency  Min 2X/week    Precautions / Restrictions Precautions Precautions: Fall Required Braces or Orthoses: Other Brace/Splint Other Brace/Splint: L wrist Restrictions Other Position/Activity Restrictions: L wrist is painful, so used L PFRW to allow for WBing through UE       ADL  Grooming: Performed;Wash/dry face;Minimal assistance Where Assessed - Grooming: Unsupported sitting Toilet Transfer: Performed;Moderate assistance Toilet Transfer Method: Sit to stand Transfers/Ambulation Related to ADLs: Pts fingers very painful with AAROM. Pt agreeable but pain was significant.  Pain stopped when OT stopped ROM.  OT encouraged pt to move L fingers- but pt hesitant. Will need further instruction or to provide education to caregiver    OT Diagnosis: Generalized weakness;Acute pain  OT Problem List: Decreased strength;Decreased safety awareness;Decreased cognition;Impaired UE functional use OT Treatment Interventions: Self-care/ADL training;Therapeutic exercise;Patient/family education   OT Goals Acute Rehab OT Goals Time For Goal Achievement: 03/12/13 Potential to Achieve Goals: Good Arm Goals Additional Arm Goal #1: Pt will perform finger ROM Lhand to decrease edema and maintain ROM to perform ADL activity using LUE  with S Arm Goal: Additional Goal #1 - Progress: Goal set today  Visit Information  Last OT Received On: 03/05/13 Assistance Needed: +1    Subjective Data  Subjective: My fingers hurt- I need to get our of here!   Prior Functioning     Home Living Type of Home: Assisted living ([facility]) Home Adaptive Equipment: Walker - rolling Prior Function Able to Take Stairs?: No Driving: No Comments: Unsure of assist level-pt appeared confused-stated she lived at home, could not give any information about facility.  Communication Communication: HOH         Vision/Perception Vision - History Patient Visual Report: No change from baseline   Cognition  Cognition Arousal/Alertness: Awake/alert Behavior During Therapy: WFL for tasks assessed/performed Overall Cognitive Status: No family/caregiver present to determine baseline cognitive functioning Area of Impairment: Memory    Extremity/Trunk Assessment Right Upper Extremity Assessment RUE ROM/Strength/Tone: WFL for tasks assessed Left Upper Extremity Assessment LUE ROM/Strength/Tone: Deficits;Unable to fully assess;Due to pain LUE ROM/Strength/Tone Deficits: Encouraged finger ROM.  Pt hesitant due to pain, but OT able to range fingers with AAROM in all planes of motion with enouragement  Right Lower Extremity Assessment RLE ROM/Strength/Tone: Deficits RLE ROM/Strength/Tone Deficits: Strength at least 4/5 with functional activity Left Lower Extremity Assessment LLE ROM/Strength/Tone: Deficits LLE ROM/Strength/Tone Deficits: Strength at least 4/5 with functional activity. Pt also complained of L ankle pain while sitting EOB-able to range actively Trunk Assessment Trunk Assessment: Kyphotic     Mobility Bed Mobility Bed Mobility: Supine to Sit Supine to Sit: 3: Mod assist Details for Bed Mobility Assistance: Assist for bil LEs off bed and trunk to upright. VCS safety, technique.  Transfers Sit to Stand: 3: Mod assist;From  elevated  surface;From chair/3-in-1 Stand to Sit: 3: Mod assist;To chair/3-in-1 Details for Transfer Assistance: Multimodal cues for safety, technique, hand placement. Assist to rise, stabilize, control descent.         Balance Balance Balance Assessed: Yes Static Standing Balance Static Standing - Balance Support: Bilateral upper extremity supported Static Standing - Level of Assistance: 3: Mod assist Dynamic Standing Balance Dynamic Standing - Balance Support: Bilateral upper extremity supported Dynamic Standing - Level of Assistance: 3: Mod assist   End of Session  Pt left in recliner with phone and call bell with in pts reach  GO     Verlee Pope, [place]ung 03/05/2013, 2:45 PM

## 2013-03-05 NOTE — Evaluation (Addendum)
Physical Therapy Evaluation Patient Details Name: Cathy Blackwell MRN: [id number] DOB: 06-15-1919 Today's Date: 03/05/2013 Time: 1130-1150 PT Time Calculation (min): 20 min  PT Assessment / Plan / Recommendation Clinical Impression  79 yo female admitted with L wrist swelling, weakness. S/p wrist joint wash out and L wrist splint/brace. On eval, pt required Mod assist for mobility-able to ambulate ~30 feet with PFRW. Demonstrates general weakness and decreased activity tolerance. Pt appeared confused-states she was from home/had a house (chart states pt is from ALF). Recommend SNF vs ALF (if facility is able to provide current level of care).     PT Assessment  Patient needs continued PT services    Follow Up Recommendations  SNF vs (ALF if facility is able to provide current level of care). Depending on progress    Does the patient have the potential to tolerate intense rehabilitation      Barriers to Discharge        Equipment Recommendations   (walker if pt does not already have-may need L platform if pt remains unable to functionally use/WB through L hand)    Recommendations for Other Services OT consult   Frequency Min 3X/week    Precautions / Restrictions Precautions Precautions: Fall Required Braces or Orthoses: Other Brace/Splint Other Brace/Splint: L wrist Restrictions Other Position/Activity Restrictions: L wrist is painful, so used L PFRW to allow for WBing through UE   Pertinent Vitals/Pain C/o L UE, L ankle pain. Unable to rate.       Mobility  Bed Mobility Bed Mobility: Supine to Sit Supine to Sit: 3: Mod assist Details for Bed Mobility Assistance: Assist for bil LEs off bed and trunk to upright. VCS safety, technique.  Transfers Transfers: Sit to Stand;Stand to Sit Sit to Stand: 3: Mod assist;From bed;From elevated surface Stand to Sit: 3: Mod assist;To bed Details for Transfer Assistance: x 2. On 1st attempt, pt stood for 5 seconds then sit down  abruptly. Multimodal cues for safety, technique, hand placement. Assist to rise, stabilize, control descent.  Ambulation/Gait Ambulation/Gait Assistance: 3: Mod assist Ambulation Distance (Feet): 30 Feet Assistive device: Left platform walker Ambulation/Gait Assistance Details: Assist to stabilize throughout ambulation and to maneuver RW. VCs safety, posture.  Gait Pattern: Step-through pattern;Decreased step length - right;Decreased step length - left;Decreased stride length;Trunk flexed;Antalgic    Exercises     PT Diagnosis: Altered mental status;Acute pain;Generalized weakness;Abnormality of gait;Difficulty walking  PT Problem List: Decreased strength;Decreased activity tolerance;Decreased balance;Decreased mobility;Pain;Decreased knowledge of use of DME;Decreased knowledge of precautions;Decreased cognition PT Treatment Interventions: DME instruction;Gait training;Functional mobility training;Therapeutic activities;Therapeutic exercise;Balance training;Patient/family education   PT Goals Acute Rehab PT Goals PT Goal Formulation: With patient Time For Goal Achievement: 03/19/13 Potential to Achieve Goals: Good Pt will go Supine/Side to Sit: with supervision PT Goal: Supine/Side to Sit - Progress: Goal set today Pt will go Sit to Supine/Side: with supervision PT Goal: Sit to Supine/Side - Progress: Goal set today Pt will go Sit to Stand: with supervision PT Goal: Sit to Stand - Progress: Goal set today Pt will Ambulate: 51 - 150 feet;with rolling walker;with min assist PT Goal: Ambulate - Progress: Goal set today  Visit Information  Last PT Received On: 03/05/13 Assistance Needed: +1    Subjective Data  Subjective: It hurts when I move. You know, I had surgery on this arm Patient Stated Goal: home   Prior Functioning  Home Living Type of Home: Assisted living [facility]) Home Adaptive Equipment: Walker - rolling Prior Function Able  to Take Stairs?: No Driving:  No Comments: Unsure of assist level-pt appeared confused-stated she lived at home, could not give any information about facility.  Communication Communication: HOH    Cognition  Cognition Arousal/Alertness: Awake/alert Behavior During Therapy: WFL for tasks assessed/performed Overall Cognitive Status: Impaired/Different from baseline Area of Impairment: Memory    Extremity/Trunk Assessment Right Lower Extremity Assessment RLE ROM/Strength/Tone: Deficits RLE ROM/Strength/Tone Deficits: Strength at least 4/5 with functional activity Left Lower Extremity Assessment LLE ROM/Strength/Tone: Deficits LLE ROM/Strength/Tone Deficits: Strength at least 4/5 with functional activity. Pt also complained of L ankle pain while sitting EOB-able to range actively Trunk Assessment Trunk Assessment: Kyphotic   Balance Balance Balance Assessed: Yes Static Standing Balance Static Standing - Balance Support: Bilateral upper extremity supported Static Standing - Level of Assistance: 3: Mod assist Dynamic Standing Balance Dynamic Standing - Balance Support: Bilateral upper extremity supported Dynamic Standing - Level of Assistance: 3: Mod assist  End of Session PT - End of Session Equipment Utilized During Treatment: Gait belt Activity Tolerance: Patient limited by pain;Patient limited by fatigue Patient left: in bed;with call bell/phone within reach  GP     Rebeca Alert, MPT Pager: [phone]

## 2013-03-05 NOTE — Progress Notes (Signed)
Taken by EMS

## 2013-03-05 NOTE — Progress Notes (Signed)
Clinical Social Work  CSW faxed DC summary and FL2 to [facility]. ALF agreeable to accept patient back to facility today and agreeable to arrange PT, OT and RN through their [facility] agency. CSW prepared DC packet and informed patient, dtr and RN of dc plans. All parties agreeable. CSW coordinated transportation via [place]. CSW is signing off but available if needed.  Unk Lightning, LCSW (Coverage for [employer])

## 2013-03-05 NOTE — Discharge Summary (Signed)
Physician Discharge Summary  Cathy Blackwell ZOX:[id number] DOB: 1919/08/05 DOA: 03/01/2013  PCP: No primary provider on file.  Admit date: 03/01/2013 Discharge date: 03/05/2013  Time spent: 45 minutes  Recommendations for Outpatient Follow-up:  1. Follow upw ith PCP and go over the urine cultures report.  2. Follow up with Dr Izora Ribas regarding wirst aspirate cultures.   Discharge Diagnoses:  Principal Problem:   Swelling of joint, wrist, left Active Problems:   Leucocytosis   Discharge Condition: fair  Diet recommendation: regular  Filed Weights   03/02/13 0010  Weight: 50.803 kg (112 lb)    History of present illness:  Cathy Blackwell is a 79 y.o. female history of depression who lives in nursing home was brought to the ER patient was complaining of increasing pain in her left wrist since last night. Patient's daughter states that patient may have had swelling and pain in the left wrist for last 2 days which has gradually worsened. Patient did not have any fever chills or any fall or trauma. X-rays done in the ER show possibility of CPPD arthritis. Since patient has significant pain and leukocytosis at this time patient has been admitted for further management. ER physician Dr. Patria Mane tried to aspirate the joint but was not successful. Hand surgeon on call Dr. Izora Ribas was consulted and will be seeing in consult.   Hospital Course:  Left wrist pain and swelling possibly septic arthritis - continue with clindamycin to complete the course and the pain relief medications. Hand surgeon Dr. Izora Ribas on board. S/p Arthrotomy and irrigation of the left wrist joint. Cultures from the wrist joint negative so far. Follow up with Dr Izora Ribas if symptoms persist.  1. Depression - continue home medications. 2. Leukocytosis - probably from #1 reason. Resolved.  3. UTI: RECEIVED 3 days of IV antibiotics. She isbeing discharged on clindamycin to complete the course. Follow up urine cultures with PCP.   4. Hypokalemia: repleted as needed.  5. Thrombocytopenia: worsening , no evidence of any bleeding.    Procedures:  S/p arthrotomy and irrigation of thel eft wrist joint.  Consultations:  Hand surgery  Discharge Exam: Filed Vitals:   03/04/13 0700 03/04/13 1352 03/04/13 2122 03/05/13 0550  BP:  118/43 103/52 115/68  Pulse:  55 63 67  Temp: 99.9 F (37.7 C) 97.4 F (36.3 C) 97.6 F (36.4 C) 97.6 F (36.4 C)  TempSrc: Oral Oral Oral Oral  Resp:  18 20 16   Height:      Weight:      SpO2:  100% 100% 100%    General: Alert afebrile comfortable  Cardiovascular: s1s2  Respiratory: CTAB, no wheezing or rhonchi  Abdomen: soft NT ND BS+  Musculoskeletal: LEFT WRIST SWELLING, in a splint.    Discharge Instructions  Discharge Orders   Future Orders Complete By Expires     Discharge instructions  As directed     Comments:      Follow up with orthopedics as recommended.        Medication List    TAKE these medications       aspirin 81 MG chewable tablet  Chew 81 mg by mouth every morning.     calcium-vitamin D 500-200 MG-UNIT per tablet  Commonly known as:  OSCAL WITH D  Take 1 tablet by mouth every morning.     clindamycin 300 MG capsule  Commonly known as:  CLEOCIN  Take 2 capsules (600 mg total) by mouth 3 (three) times daily.  feeding supplement Liqd  Take 237 mLs by mouth 2 (two) times daily between meals.     ibuprofen 400 MG tablet  Commonly known as:  ADVIL,MOTRIN  Take 1 tablet (400 mg total) by mouth every 6 (six) hours as needed.     mirtazapine 7.5 MG tablet  Commonly known as:  REMERON  Take 7.5 mg by mouth at bedtime.     multivitamin with minerals Tabs  Take 1 tablet by mouth daily.     QUEtiapine 25 MG tablet  Commonly known as:  SEROQUEL  Take 25 mg by mouth at bedtime.           Follow-up Information   Call Molinda Bailiff, MD. (if symptoms persist or worsen as needed)    Contact information:   [address] [place] [address] [phone]        The results of significant diagnostics from this hospitalization (including imaging, microbiology, ancillary and laboratory) are listed below for reference.    Significant Diagnostic Studies: Dg Wrist Complete Left  03-05-2013  *RADIOLOGY REPORT*  Clinical Data: Pain and swelling of the dorsal aspect of the left wrist.  LEFT WRIST - COMPLETE 3+ VIEW  Comparison: None.  Findings: The patient has extensive chondrocalcinosis in the radiocarpal joint as well as at the first carpal metacarpal joint consistent with CPPD arthritis.  No fractures.  IMPRESSION: CPPD arthritis.   Original Report Authenticated By: Francene Boyers, M.D.    Dg Hand Complete Left  03-05-13  *RADIOLOGY REPORT*  Clinical Data: Pain and swelling of the left hand.  LEFT HAND - COMPLETE 3+ VIEW  Comparison: None.  Findings: Diffuse osteoarthritis of the interphalangeal joints and of the metacarpal phalangeal joints.  There is chondrocalcinosis in the radiocarpal joint and at the first carpal metacarpal joint.  No fracture or dislocation.  IMPRESSION: Diffuse arthritic changes.  No acute abnormalities.   Original Report Authenticated By: Francene Boyers, M.D.     Microbiology: Recent Results (from the past 240 hour(s))  BODY FLUID CULTURE     Status: None   Collection Time    03-05-2013  7:40 AM      Result Value Range Status   Specimen Description SYNOVIAL LT WRIST   Final   Special Requests NONE   Final   Gram Stain     Final   Value: FEW WBC PRESENT, PREDOMINANTLY PMN     NO ORGANISMS SEEN     Gram Stain Report Called to,Read Back By and Verified With: Gram Stain Report Called to,Read Back By and Verified With: DR COLEY 03-05-13 1:26PM BY MILSH   Culture NO GROWTH 3 DAYS   Final   Report Status 03/05/2013 FINAL   Final  SURGICAL PCR SCREEN     Status: None   Collection Time    Mar 05, 2013 12:50 PM      Result Value Range Status   MRSA, PCR NEGATIVE  NEGATIVE Final    Staphylococcus aureus NEGATIVE  NEGATIVE Final   Comment:            The Xpert SA Assay (FDA     approved for NASAL specimens     in patients over 61 years of age),     is one component of     a comprehensive surveillance     program.  Test performance has     been validated by [employer] for patients greater     than or equal to  69 year old.     It is not intended     to diagnose infection nor to     guide or monitor treatment.     Labs: Basic Metabolic Panel:  Recent Labs Lab 03/02/13 0205 03/02/13 1020 03/03/13 0805 03/04/13 1212  NA 135 134* 134* 131*  K 3.7 3.6 3.2* 3.7  CL 99 98 99 94*  CO2 26 27 29 29   GLUCOSE 104* 116* 122* 101*  BUN 11 11 12 12   CREATININE 0.59 0.54 0.72 0.77  CALCIUM 9.6 9.1 8.5 8.7   Liver Function Tests: No results found for this basename: AST, ALT, ALKPHOS, BILITOT, PROT, ALBUMIN,  in the last 168 hours No results found for this basename: LIPASE, AMYLASE,  in the last 168 hours No results found for this basename: AMMONIA,  in the last 168 hours CBC:  Recent Labs Lab 03/02/13 0205 03/02/13 1020 03/03/13 0805  WBC 13.4* 13.3* 10.5  NEUTROABS 8.7* 10.8*  --   HGB 11.0* 10.4* 9.0*  HCT 31.6* 30.1* 26.1*  MCV 89.5 91.2 91.3  PLT 138* 118* 110*   Cardiac Enzymes: No results found for this basename: CKTOTAL, CKMB, CKMBINDEX, TROPONINI,  in the last 168 hours BNP: BNP (last 3 results) No results found for this basename: PROBNP,  in the last 8760 hours CBG:  Recent Labs Lab 03/03/13 1748 03/04/13 0108 03/04/13 0634 03/04/13 0740 03/05/13 0554  GLUCAP 127* 102* 103* 130* 107*       Signed:  Tanda Morrissey  [facility] 03/05/2013, 1:29 PM

## 2013-03-05 NOTE — Progress Notes (Signed)
ANTIBIOTIC CONSULT NOTE - Follow Up  Pharmacy Consult for vancomycin Indication: possible septic arthritis  No Known Allergies  Patient Measurements: Height: 5\' 2"  (157.5 cm) Weight: 112 lb (50.803 kg) IBW/kg (Calculated) : 50.1  Vital Signs: Temp: 97.6 F (36.4 C) (04/21 0550) Temp src: Oral (04/21 0550) BP: 115/68 mmHg (04/21 0550) Pulse Rate: 67 (04/21 0550) Intake/Output from previous day: 04/20 0701 - 04/21 0700 In: 800 [P.O.:200; IV Piggyback:600] Out: 450 [Urine:450] Intake/Output from this shift:    Labs:  Recent Labs  03/03/13 0805 03/04/13 1212  WBC 10.5  --   HGB 9.0*  --   PLT 110*  --   CREATININE 0.72 0.77   Estimated Creatinine Clearance: 36.2 ml/min (by C-G formula based on Cr of 0.77). No results found for this basename: VANCOTROUGH, Leodis Binet, VANCORANDOM, GENTTROUGH, GENTPEAK, GENTRANDOM, TOBRATROUGH, TOBRAPEAK, TOBRARND, AMIKACINPEAK, AMIKACINTROU, AMIKACIN,  in the last 72 hours   Microbiology: Recent Results (from the past 720 hour(s))  BODY FLUID CULTURE     Status: None   Collection Time    03/02/13  7:40 AM      Result Value Range Status   Specimen Description SYNOVIAL LT WRIST   Final   Special Requests NONE   Final   Gram Stain     Final   Value: FEW WBC PRESENT, PREDOMINANTLY PMN     NO ORGANISMS SEEN     Gram Stain Report Called to,Read Back By and Verified With: Gram Stain Report Called to,Read Back By and Verified With: DR COLEY 03/02/2013 1:26PM BY MILSH   Culture NO GROWTH 2 DAYS   Final   Report Status PENDING   Incomplete  SURGICAL PCR SCREEN     Status: None   Collection Time    03/02/13 12:50 PM      Result Value Range Status   MRSA, PCR NEGATIVE  NEGATIVE Final   Staphylococcus aureus NEGATIVE  NEGATIVE Final   Comment:            The Xpert SA Assay (FDA     approved for NASAL specimens     in patients over 59 years of age),     is one component of     a comprehensive surveillance     program.  Test performance has      been validated by [employer] for patients greater     than or equal to 76 year old.     It is not intended     to diagnose infection nor to     guide or monitor treatment.    Medical History: Past Medical History  Diagnosis Date  . Incontinence of urine   . Diverticulitis   . Fracture of tibial plateau, closed 07/14/2012  . Osteoarthritis     Medications:  Scheduled:  . cefTRIAXone (ROCEPHIN)  IV  1 g Intravenous Q24H  . feeding supplement  237 mL Oral BID BM  . mirtazapine  7.5 mg Oral QHS  . multivitamin with minerals  1 tablet Oral Daily  . QUEtiapine  25 mg Oral QHS  . vancomycin  750 mg Intravenous Q24H   Infusions:    Assessment: 79 y.o. female history of depression who lives in nursing home was brought to the ER patient was complaining of increasing pain in her left wrist since last night. Patient's daughter states that patient may have had swelling and pain in the left wrist for last 2 days which has gradually worsened. Patient did  not have any fever chills or any fall or trauma. X-rays done in the ER show possibility of CPPD arthritis. Since patient has significant pain and leukocytosis at this time patient has been admitted for further management. ER physician Dr. Patria Mane tried to aspirate the joint but was not successful. Differential includes gouty arthritis vs septic arthritis  Vanc/Rocephin Day #4  Labs stable, Afebrile  S/P arthrotomy and irrigation of left wrist joint on 4/18 - cx NGTD  Goal of Therapy:  Vancomycin trough level 15-20 mcg/ml  Plan:  1) Continue Vancomycin 750mg  IV q24 for now 2) Check vanc trough tomorrow AM prior to next dose  Hessie Knows, PharmD, BCPS Pager [phone] 03/05/2013 10:22 AM

## 2013-03-06 LAB — URINE CULTURE: Colony Count: NO GROWTH

## 2016-03-11 ENCOUNTER — Emergency Department (HOSPITAL_COMMUNITY)
Admission: EM | Admit: 2016-03-11 | Discharge: 2016-03-11 | Disposition: A | Discharge status: Home / Self Care | Payer: Medicare Other | Attending: Emergency Medicine | Admitting: Emergency Medicine

## 2016-03-11 ENCOUNTER — Emergency Department (HOSPITAL_COMMUNITY): Payer: Medicare Other

## 2016-03-11 ENCOUNTER — Encounter (HOSPITAL_COMMUNITY): Payer: Self-pay

## 2016-03-11 DIAGNOSIS — Z8781 Personal history of (healed) traumatic fracture: Secondary | ICD-10-CM | POA: Insufficient documentation

## 2016-03-11 DIAGNOSIS — Z79899 Other long term (current) drug therapy: Secondary | ICD-10-CM | POA: Diagnosis not present

## 2016-03-11 DIAGNOSIS — R410 Disorientation, unspecified: Secondary | ICD-10-CM | POA: Insufficient documentation

## 2016-03-11 DIAGNOSIS — M1990 Unspecified osteoarthritis, unspecified site: Secondary | ICD-10-CM | POA: Diagnosis not present

## 2016-03-11 DIAGNOSIS — N390 Urinary tract infection, site not specified: Secondary | ICD-10-CM | POA: Diagnosis not present

## 2016-03-11 DIAGNOSIS — R55 Syncope and collapse: Secondary | ICD-10-CM | POA: Diagnosis present

## 2016-03-11 DIAGNOSIS — Z8719 Personal history of other diseases of the digestive system: Secondary | ICD-10-CM | POA: Diagnosis not present

## 2016-03-11 DIAGNOSIS — Z87891 Personal history of nicotine dependence: Secondary | ICD-10-CM | POA: Diagnosis not present

## 2016-03-11 LAB — CBC WITH DIFFERENTIAL/PLATELET
Basophils Absolute: 0 10*3/uL (ref 0.0–0.1)
Basophils Relative: 0 %
EOS ABS: 0.2 10*3/uL (ref 0.0–0.7)
EOS PCT: 1 %
HCT: 31.1 % — ABNORMAL LOW (ref 36.0–46.0)
Hemoglobin: 10.2 g/dL — ABNORMAL LOW (ref 12.0–15.0)
LYMPHS ABS: 3.7 10*3/uL (ref 0.7–4.0)
LYMPHS PCT: 34 %
MCH: 29.7 pg (ref 26.0–34.0)
MCHC: 32.8 g/dL (ref 30.0–36.0)
MCV: 90.7 fL (ref 78.0–100.0)
MONO ABS: 0.7 10*3/uL (ref 0.1–1.0)
Monocytes Relative: 6 %
Neutro Abs: 6.2 10*3/uL (ref 1.7–7.7)
Neutrophils Relative %: 59 %
PLATELETS: 132 10*3/uL — AB (ref 150–400)
RBC: 3.43 MIL/uL — AB (ref 3.87–5.11)
RDW: 14.3 % (ref 11.5–15.5)
WBC: 10.7 10*3/uL — AB (ref 4.0–10.5)

## 2016-03-11 LAB — COMPREHENSIVE METABOLIC PANEL
ALT: 13 U/L — ABNORMAL LOW (ref 14–54)
ANION GAP: 8 (ref 5–15)
AST: 22 U/L (ref 15–41)
Albumin: 3.2 g/dL — ABNORMAL LOW (ref 3.5–5.0)
Alkaline Phosphatase: 65 U/L (ref 38–126)
BUN: 13 mg/dL (ref 6–20)
CHLORIDE: 106 mmol/L (ref 101–111)
CO2: 26 mmol/L (ref 22–32)
CREATININE: 0.79 mg/dL (ref 0.44–1.00)
Calcium: 9.1 mg/dL (ref 8.9–10.3)
Glucose, Bld: 92 mg/dL (ref 65–99)
POTASSIUM: 3.7 mmol/L (ref 3.5–5.1)
SODIUM: 140 mmol/L (ref 135–145)
Total Bilirubin: 0.5 mg/dL (ref 0.3–1.2)
Total Protein: 7.2 g/dL (ref 6.5–8.1)

## 2016-03-11 LAB — I-STAT TROPONIN, ED: TROPONIN I, POC: 0 ng/mL (ref 0.00–0.08)

## 2016-03-11 LAB — URINALYSIS, ROUTINE W REFLEX MICROSCOPIC
Bilirubin Urine: NEGATIVE
GLUCOSE, UA: NEGATIVE mg/dL
Hgb urine dipstick: NEGATIVE
KETONES UR: NEGATIVE mg/dL
Nitrite: POSITIVE — AB
PROTEIN: NEGATIVE mg/dL
Specific Gravity, Urine: 1.017 (ref 1.005–1.030)
pH: 7.5 (ref 5.0–8.0)

## 2016-03-11 LAB — URINE MICROSCOPIC-ADD ON: RBC / HPF: NONE SEEN RBC/hpf (ref 0–5)

## 2016-03-11 LAB — CBG MONITORING, ED: Glucose-Capillary: 110 mg/dL — ABNORMAL HIGH (ref 65–99)

## 2016-03-11 MED ORDER — CEPHALEXIN 500 MG PO CAPS: 500.0000 mg | ORAL_CAPSULE | Freq: Four times a day (QID) | ORAL | Status: DC

## 2016-03-11 MED ORDER — CEPHALEXIN 250 MG PO CAPS
500.0000 mg | ORAL_CAPSULE | Freq: Once | ORAL | Status: AC
  Administered 2016-03-11: 500 mg via ORAL
  Filled 2016-03-11: qty 2

## 2016-03-11 NOTE — ED Provider Notes (Signed)
CSN: [id number]     Arrival date & time 03/11/16  1112 History   First MD Initiated Contact with Patient 03/11/16 1113     Chief Complaint  Patient presents with  . Near Syncope     (Consider location/radiation/quality/duration/timing/severity/associated sxs/prior Treatment) Patient is a 82 y.o. female presenting with near-syncope. The history is provided by the patient and a relative. No language interpreter was used.  Near Syncope Pertinent negatives include no abdominal pain, chest pain, congestion, fever, headaches or myalgias.   DOVIE KAPUSTA is a 82 y.o. female  with a PMH of osteoarthritis, memory loss who presents to the Emergency Department from nursing facility via EMS for possible syncopal episode. Per nursing staff, patient was sitting in a wheelchair when staff realized her head had drooped and she was not responding to their commands. Unsure of the amount of time sxs lasted. By the time the fire department arrived, patient was back to her baseline per nursing staff. Daughter at bedside states that she appears a little bit more confused than usual. No new medications recently. No recent illness. No cardiac history.  Past Medical History  Diagnosis Date  . Incontinence of urine   . Diverticulitis   . Fracture of tibial plateau, closed 07/14/2012  . Osteoarthritis    Past Surgical History  Procedure Laterality Date  . Colon surgery      Part  of colon removed  . Appendectomy    . I&d extremity Left 03/02/2013    Procedure: IRRIGATION AND DEBRIDEMENT EXTREMITY;  Surgeon: Johnette Abraham, MD;  Location: [facility];  Service: Plastics;  Laterality: Left;  liter saline 14 g angio cath   History reviewed. No pertinent family history. Social History  Substance Use Topics  . Smoking status: Former Smoker -- 10 years    Quit date: 07/13/1969  . Smokeless tobacco: Never Used  . Alcohol Use: No     Comment: quit drinking in late 1970's - "was a heavy drinker at one time"   OB  History    No data available     Review of Systems  Constitutional: Negative for fever.  HENT: Negative for congestion.   Eyes: Negative for visual disturbance.  Respiratory: Negative for shortness of breath.   Cardiovascular: Positive for near-syncope. Negative for chest pain.  Gastrointestinal: Negative for abdominal pain.  Musculoskeletal: Negative for myalgias.  Allergic/Immunologic: Negative for immunocompromised state.  Neurological: Positive for syncope. Negative for headaches.  Psychiatric/Behavioral: Positive for confusion.     Allergies  Review of patient's allergies indicates no known allergies.  Home Medications   Prior to Admission medications   Medication Sig Start Date End Date Taking? Authorizing Provider  gentamicin (GARAMYCIN) 0.3 % ophthalmic solution Place 2 drops into the left eye 2 (two) times daily. 03/03/16 03/11/16 Yes Historical Provider, MD  liver oil-zinc oxide (DESITIN) 40 % ointment Apply 1 application topically daily as needed for irritation.   Yes Historical Provider, MD  Multiple Vitamin (MULTIVITAMIN WITH MINERALS) TABS Take 1 tablet by mouth daily. 03/05/13  Yes Kathlen Mody, MD  QUEtiapine (SEROQUEL) 50 MG tablet Take 50 mg by mouth 2 (two) times daily.   Yes Historical Provider, MD  cephALEXin (KEFLEX) 500 MG capsule Take 1 capsule (500 mg total) by mouth 4 (four) times daily. 03/11/16   Chase Picket Ward, PA-C  clindamycin (CLEOCIN) 300 MG capsule Take 2 capsules (600 mg total) by mouth 3 (three) times daily. Patient not taking: Reported on 03/11/2016 03/05/13   Kathlen Mody,  MD  feeding supplement (ENSURE COMPLETE) LIQD Take 237 mLs by mouth 2 (two) times daily between meals. Patient not taking: Reported on 03/11/2016 03/05/13   Kathlen ModyVijaya Akula, MD  ibuprofen (ADVIL,MOTRIN) 400 MG tablet Take 1 tablet (400 mg total) by mouth every 6 (six) hours as needed. Patient not taking: Reported on 03/11/2016 03/05/13   Kathlen ModyVijaya Akula, MD   BP 149/62 mmHg  Pulse 58   Temp(Src) 98.1 F (36.7 C) (Oral)  Resp 24  SpO2 100% Physical Exam  Constitutional: She appears well-developed and well-nourished.  Alert and in no acute distress  HENT:  Head: Normocephalic and atraumatic.  Cardiovascular: Normal rate, normal heart sounds and intact distal pulses.  Exam reveals no gallop and no friction rub.   No murmur heard. Pulmonary/Chest: Effort normal and breath sounds normal. No respiratory distress. She has no wheezes. She has no rales. She exhibits no tenderness.  Abdominal: Soft. Bowel sounds are normal. She exhibits no distension and no mass. There is no tenderness. There is no rebound and no guarding.  Musculoskeletal: She exhibits no edema.  Neurological: She is alert.  Alert, unable to give a coherent history. Able to follow commands.  Cranial Nerves:  II:  Peripheral visual fields grossly normal, pupils equal, round, reactive to light III, IV, VI: EOM intact bilaterally, ptosis not present V,VII: smile symmetric, eyes kept closed tightly against resistance, facial light touch sensation equal VIII: hearing grossly normal IX, X: symmetric soft palate movement, uvula elevates symmetrically  XI: bilateral shoulder shrug symmetric and strong XII: midline tongue extension 5/5 muscle strength in upper and lower extremities bilaterally including strong and equal grip strength and dorsiflexion/plantar flexion Sensory to light touch normal in all four extremities.  Normal finger-to-nose and rapid alternating movements  Skin: Skin is warm and dry.  Nursing note and vitals reviewed.   ED Course  Procedures (including critical care time) Labs Review Labs Reviewed  CBC WITH DIFFERENTIAL/PLATELET - Abnormal; Notable for the following:    WBC 10.7 (*)    RBC 3.43 (*)    Hemoglobin 10.2 (*)    HCT 31.1 (*)    Platelets 132 (*)    All other components within normal limits  COMPREHENSIVE METABOLIC PANEL - Abnormal; Notable for the following:    Albumin 3.2  (*)    ALT 13 (*)    All other components within normal limits  URINALYSIS, ROUTINE W REFLEX MICROSCOPIC (NOT AT [facility]) - Abnormal; Notable for the following:    APPearance CLOUDY (*)    Nitrite POSITIVE (*)    Leukocytes, UA MODERATE (*)    All other components within normal limits  URINE MICROSCOPIC-ADD ON - Abnormal; Notable for the following:    Squamous Epithelial / LPF 6-30 (*)    Bacteria, UA MANY (*)    All other components within normal limits  CBG MONITORING, ED - Abnormal; Notable for the following:    Glucose-Capillary 110 (*)    All other components within normal limits  URINE CULTURE  I-STAT TROPOININ, ED    Imaging Review Ct Head Wo Contrast  03/11/2016  CLINICAL DATA:  Syncope.  Altered mental status EXAM: CT HEAD WITHOUT CONTRAST TECHNIQUE: Contiguous axial images were obtained from the base of the skull through the vertex without intravenous contrast. COMPARISON:  CT head 01/22/2013 FINDINGS: Advanced atrophy. Extensive chronic microvascular ischemic change throughout the white matter is unchanged. Negative for acute infarct. Negative for acute hemorrhage. No mass or edema. Negative calvarium. IMPRESSION: Advanced atrophy and chronic  microvascular ischemia. No acute abnormality. No change from the prior Electronically Signed   By: Marlan Palau M.D.   On: 03/11/2016 13:08   Dg Chest Port 1 View  03/11/2016  CLINICAL DATA:  Not acting normal. Today was slumped over in wheel chair EXAM: PORTABLE CHEST 1 VIEW COMPARISON:  01/22/2013 FINDINGS: Cardiac silhouette normal in size. No mediastinal or hilar masses or evidence of adenopathy. Chronic increased markings noted mostly in the bases. No evidence of pneumonia or pulmonary edema. No pleural effusion or pneumothorax. Skeletal structures are demineralized. There are changes consistent with hydroxyapatite deposition in the left shoulder, stable. IMPRESSION: No active disease. Electronically Signed   By: Amie Portland M.D.   On:  03/11/2016 12:28   I have personally reviewed and evaluated these images and lab results as part of my medical decision-making.   EKG Interpretation   Date/Time:  Thursday March 11 2016 11:23:11 EDT Ventricular Rate:  67 PR Interval:  174 QRS Duration: 98 QT Interval:  425 QTC Calculation: 449 R Axis:   -29 Text Interpretation:  Sinus rhythm Atrial premature complexes Low voltage,  precordial leads Abnormal R-wave progression, early transition Left  ventricular hypertrophy Sinus rhythm Premature atrial complexes Artifact  Abnormal ekg Confirmed by Gerhard Munch  MD ([id number]) on 03/11/2016  11:36:49 AM      MDM   Final diagnoses:  UTI (lower urinary tract infection)   Zunaira L Cupples presents from nursing facility for possible unwitnessed syncopal episode and more confusion than her baseline.  Labs: UA with moderate leuks, positive nitrites, 6-30 white cells and many bacteria. Urine sent for culture. OTHER labs reviewed and reassuring. Imaging: CT head with no changes from previous. Chest x-ray reassuring.  Mental status changes are likely secondary to urinary tract infection. Discussed inpatient care versus outpatient care with daughter at bedside and patient. Patient states that she wants to return back to her home in nursing facility and daughter at bedside agrees with this. Return precautions were given and included in discharge paperwork. First dose of Keflex given in ED and Rx given. PCP follow-up in 3 days recommended. All questions were answered.  Patient discussed with Dr. Jeraldine Loots who agrees with treatment plan.   [facility] Ward, PA-C 03/11/16 1403  Gerhard Munch, MD 03/12/16 2147

## 2016-03-11 NOTE — ED Notes (Signed)
Pt presents to er with ems with complaints of a syncopal episode in her wheelchair, she presents to er alert and at her baseline with some confusion, she is talking out of her head more per her daughter, ems states she urinated on herself while trying to stand up and she had a strong odor, she has also been complaining of her left eye hurting her, patient denies any pain

## 2016-03-11 NOTE — Discharge Instructions (Signed)
Please take all of your antibiotics until finished!  Repeat urine analysis in 3 days. Stay very well hydrated with plenty of water throughout the day.  Please seek immediate care if you develop the following: Your symptoms are no better or worse in 3 days. There is severe back pain or lower abdominal pain.  You develop chills.  You have a fever.  There is nausea or vomiting.  There is continued burning or discomfort with urination.

## 2016-03-11 NOTE — ED Notes (Signed)
Pt transported to CT ?

## 2016-03-12 LAB — URINE CULTURE

## 2016-06-27 ENCOUNTER — Emergency Department (HOSPITAL_COMMUNITY): Payer: Medicare Other

## 2016-06-27 ENCOUNTER — Encounter (HOSPITAL_COMMUNITY): Payer: Self-pay | Admitting: Emergency Medicine

## 2016-06-27 ENCOUNTER — Inpatient Hospital Stay (HOSPITAL_COMMUNITY)
Admission: EM | Admit: 2016-06-27 | Discharge: 2016-07-02 | DRG: 871 | Disposition: A | Discharge status: Skilled Nursing Facility | Payer: Medicare Other | Attending: Family Medicine | Admitting: Family Medicine

## 2016-06-27 DIAGNOSIS — N390 Urinary tract infection, site not specified: Secondary | ICD-10-CM | POA: Diagnosis present

## 2016-06-27 DIAGNOSIS — F0390 Unspecified dementia without behavioral disturbance: Secondary | ICD-10-CM | POA: Diagnosis present

## 2016-06-27 DIAGNOSIS — K810 Acute cholecystitis: Secondary | ICD-10-CM | POA: Diagnosis present

## 2016-06-27 DIAGNOSIS — Z79899 Other long term (current) drug therapy: Secondary | ICD-10-CM

## 2016-06-27 DIAGNOSIS — I10 Essential (primary) hypertension: Secondary | ICD-10-CM | POA: Diagnosis present

## 2016-06-27 DIAGNOSIS — W19XXXA Unspecified fall, initial encounter: Secondary | ICD-10-CM | POA: Diagnosis present

## 2016-06-27 DIAGNOSIS — R7989 Other specified abnormal findings of blood chemistry: Secondary | ICD-10-CM

## 2016-06-27 DIAGNOSIS — Z66 Do not resuscitate: Secondary | ICD-10-CM | POA: Diagnosis present

## 2016-06-27 DIAGNOSIS — R32 Unspecified urinary incontinence: Secondary | ICD-10-CM | POA: Diagnosis present

## 2016-06-27 DIAGNOSIS — Z87891 Personal history of nicotine dependence: Secondary | ICD-10-CM

## 2016-06-27 DIAGNOSIS — M1990 Unspecified osteoarthritis, unspecified site: Secondary | ICD-10-CM | POA: Diagnosis present

## 2016-06-27 DIAGNOSIS — R4182 Altered mental status, unspecified: Secondary | ICD-10-CM | POA: Diagnosis not present

## 2016-06-27 DIAGNOSIS — B9620 Unspecified Escherichia coli [E. coli] as the cause of diseases classified elsewhere: Secondary | ICD-10-CM | POA: Diagnosis present

## 2016-06-27 DIAGNOSIS — F329 Major depressive disorder, single episode, unspecified: Secondary | ICD-10-CM | POA: Diagnosis present

## 2016-06-27 DIAGNOSIS — D696 Thrombocytopenia, unspecified: Secondary | ICD-10-CM | POA: Diagnosis present

## 2016-06-27 DIAGNOSIS — D6489 Other specified anemias: Secondary | ICD-10-CM | POA: Diagnosis present

## 2016-06-27 DIAGNOSIS — R451 Restlessness and agitation: Secondary | ICD-10-CM

## 2016-06-27 DIAGNOSIS — S0093XA Contusion of unspecified part of head, initial encounter: Secondary | ICD-10-CM | POA: Diagnosis present

## 2016-06-27 DIAGNOSIS — A419 Sepsis, unspecified organism: Secondary | ICD-10-CM | POA: Diagnosis not present

## 2016-06-27 DIAGNOSIS — W050XXA Fall from non-moving wheelchair, initial encounter: Secondary | ICD-10-CM | POA: Diagnosis present

## 2016-06-27 DIAGNOSIS — E876 Hypokalemia: Secondary | ICD-10-CM | POA: Diagnosis present

## 2016-06-27 DIAGNOSIS — G9340 Encephalopathy, unspecified: Secondary | ICD-10-CM | POA: Diagnosis present

## 2016-06-27 DIAGNOSIS — R945 Abnormal results of liver function studies: Secondary | ICD-10-CM | POA: Diagnosis present

## 2016-06-27 DIAGNOSIS — I700 Atherosclerosis of aorta: Secondary | ICD-10-CM | POA: Diagnosis present

## 2016-06-27 HISTORY — DX: Unspecified dementia, unspecified severity, without behavioral disturbance, psychotic disturbance, mood disturbance, and anxiety: F03.90

## 2016-06-27 HISTORY — DX: Essential (primary) hypertension: I10

## 2016-06-27 LAB — CBC WITH DIFFERENTIAL/PLATELET
Basophils Absolute: 0 10*3/uL (ref 0.0–0.1)
Basophils Relative: 0 %
EOS PCT: 0 %
Eosinophils Absolute: 0 10*3/uL (ref 0.0–0.7)
HCT: 28.6 % — ABNORMAL LOW (ref 36.0–46.0)
Hemoglobin: 9.8 g/dL — ABNORMAL LOW (ref 12.0–15.0)
LYMPHS PCT: 7 %
Lymphs Abs: 1.1 10*3/uL (ref 0.7–4.0)
MCH: 30.4 pg (ref 26.0–34.0)
MCHC: 34.3 g/dL (ref 30.0–36.0)
MCV: 88.8 fL (ref 78.0–100.0)
MONO ABS: 1.9 10*3/uL — AB (ref 0.1–1.0)
MONOS PCT: 12 %
NEUTROS ABS: 12.6 10*3/uL — AB (ref 1.7–7.7)
Neutrophils Relative %: 81 %
Platelets: 111 10*3/uL — ABNORMAL LOW (ref 150–400)
RBC: 3.22 MIL/uL — ABNORMAL LOW (ref 3.87–5.11)
RDW: 13.3 % (ref 11.5–15.5)
WBC: 15.6 10*3/uL — ABNORMAL HIGH (ref 4.0–10.5)

## 2016-06-27 LAB — URINE MICROSCOPIC-ADD ON: Squamous Epithelial / LPF: NONE SEEN

## 2016-06-27 LAB — COMPREHENSIVE METABOLIC PANEL
ALBUMIN: 3.1 g/dL — AB (ref 3.5–5.0)
ALT: 72 U/L — AB (ref 14–54)
AST: 110 U/L — AB (ref 15–41)
Alkaline Phosphatase: 143 U/L — ABNORMAL HIGH (ref 38–126)
Anion gap: 7 (ref 5–15)
BUN: 11 mg/dL (ref 6–20)
CHLORIDE: 102 mmol/L (ref 101–111)
CO2: 24 mmol/L (ref 22–32)
Calcium: 8.2 mg/dL — ABNORMAL LOW (ref 8.9–10.3)
Creatinine, Ser: 0.59 mg/dL (ref 0.44–1.00)
GFR calc Af Amer: >60 mL/min (ref >60)
GLUCOSE: 133 mg/dL — AB (ref 65–99)
Potassium: 3.3 mmol/L — ABNORMAL LOW (ref 3.5–5.1)
Sodium: 133 mmol/L — ABNORMAL LOW (ref 135–145)
Total Bilirubin: 2.3 mg/dL — ABNORMAL HIGH (ref 0.3–1.2)
Total Protein: 7.1 g/dL (ref 6.5–8.1)

## 2016-06-27 LAB — URINALYSIS, ROUTINE W REFLEX MICROSCOPIC
BILIRUBIN URINE: NEGATIVE
GLUCOSE, UA: NEGATIVE mg/dL
HGB URINE DIPSTICK: NEGATIVE
KETONES UR: NEGATIVE mg/dL
Nitrite: POSITIVE — AB
PH: 7.5 (ref 5.0–8.0)
Protein, ur: NEGATIVE mg/dL
Specific Gravity, Urine: 1.016 (ref 1.005–1.030)

## 2016-06-27 LAB — I-STAT CG4 LACTIC ACID, ED: Lactic Acid, Venous: 1.42 mmol/L (ref 0.5–1.9)

## 2016-06-27 MED ORDER — HALOPERIDOL LACTATE 5 MG/ML IJ SOLN
2.0000 mg | Freq: Once | INTRAMUSCULAR | Status: AC
  Administered 2016-06-27: 2 mg via INTRAVENOUS
  Filled 2016-06-27: qty 1

## 2016-06-27 MED ORDER — DEXTROSE 5 % IV SOLN
1.0000 g | Freq: Once | INTRAVENOUS | Status: AC
  Administered 2016-06-28: 1 g via INTRAVENOUS
  Filled 2016-06-27: qty 10

## 2016-06-27 MED ORDER — ACETAMINOPHEN 500 MG PO TABS
1000.0000 mg | ORAL_TABLET | Freq: Once | ORAL | Status: AC
  Administered 2016-06-27: 1000 mg via ORAL
  Filled 2016-06-27: qty 2

## 2016-06-27 MED ORDER — SODIUM CHLORIDE 0.9 % IV BOLUS (SEPSIS)
1000.0000 mL | Freq: Once | INTRAVENOUS | Status: AC
  Administered 2016-06-28: 1000 mL via INTRAVENOUS

## 2016-06-27 NOTE — ED Notes (Signed)
I attempted to collect labs twice and was unsuccessful 

## 2016-06-27 NOTE — ED Triage Notes (Signed)
Pt coming via [facility]. Pt fell out of her wheelchair trying to get oranges. 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 Pt is at baseline per SNF.  Pt has 20 gauge in Left forearm and received 300 NS on EMS.  Vitals on EMS:  138/73 100.3 temp 87 HR

## 2016-06-28 ENCOUNTER — Inpatient Hospital Stay (HOSPITAL_COMMUNITY): Payer: Medicare Other

## 2016-06-28 ENCOUNTER — Observation Stay (HOSPITAL_COMMUNITY): Payer: Medicare Other

## 2016-06-28 DIAGNOSIS — F0390 Unspecified dementia without behavioral disturbance: Secondary | ICD-10-CM | POA: Diagnosis present

## 2016-06-28 DIAGNOSIS — R451 Restlessness and agitation: Secondary | ICD-10-CM

## 2016-06-28 DIAGNOSIS — N390 Urinary tract infection, site not specified: Secondary | ICD-10-CM | POA: Diagnosis present

## 2016-06-28 DIAGNOSIS — K810 Acute cholecystitis: Secondary | ICD-10-CM

## 2016-06-28 DIAGNOSIS — Z79899 Other long term (current) drug therapy: Secondary | ICD-10-CM | POA: Diagnosis not present

## 2016-06-28 DIAGNOSIS — I700 Atherosclerosis of aorta: Secondary | ICD-10-CM | POA: Diagnosis present

## 2016-06-28 DIAGNOSIS — W19XXXA Unspecified fall, initial encounter: Secondary | ICD-10-CM

## 2016-06-28 DIAGNOSIS — D696 Thrombocytopenia, unspecified: Secondary | ICD-10-CM | POA: Diagnosis present

## 2016-06-28 DIAGNOSIS — A419 Sepsis, unspecified organism: Secondary | ICD-10-CM | POA: Diagnosis present

## 2016-06-28 DIAGNOSIS — F329 Major depressive disorder, single episode, unspecified: Secondary | ICD-10-CM

## 2016-06-28 DIAGNOSIS — Z66 Do not resuscitate: Secondary | ICD-10-CM | POA: Diagnosis present

## 2016-06-28 DIAGNOSIS — E876 Hypokalemia: Secondary | ICD-10-CM | POA: Diagnosis present

## 2016-06-28 DIAGNOSIS — F32A Depression, unspecified: Secondary | ICD-10-CM | POA: Insufficient documentation

## 2016-06-28 DIAGNOSIS — W050XXA Fall from non-moving wheelchair, initial encounter: Secondary | ICD-10-CM | POA: Diagnosis present

## 2016-06-28 DIAGNOSIS — G9340 Encephalopathy, unspecified: Secondary | ICD-10-CM | POA: Diagnosis present

## 2016-06-28 DIAGNOSIS — S0093XA Contusion of unspecified part of head, initial encounter: Secondary | ICD-10-CM | POA: Diagnosis present

## 2016-06-28 DIAGNOSIS — M1990 Unspecified osteoarthritis, unspecified site: Secondary | ICD-10-CM | POA: Diagnosis present

## 2016-06-28 DIAGNOSIS — I10 Essential (primary) hypertension: Secondary | ICD-10-CM | POA: Diagnosis present

## 2016-06-28 DIAGNOSIS — R4182 Altered mental status, unspecified: Secondary | ICD-10-CM | POA: Diagnosis present

## 2016-06-28 DIAGNOSIS — D6489 Other specified anemias: Secondary | ICD-10-CM | POA: Diagnosis present

## 2016-06-28 DIAGNOSIS — R32 Unspecified urinary incontinence: Secondary | ICD-10-CM | POA: Diagnosis present

## 2016-06-28 DIAGNOSIS — B9620 Unspecified Escherichia coli [E. coli] as the cause of diseases classified elsewhere: Secondary | ICD-10-CM | POA: Diagnosis present

## 2016-06-28 DIAGNOSIS — R945 Abnormal results of liver function studies: Secondary | ICD-10-CM | POA: Diagnosis present

## 2016-06-28 DIAGNOSIS — K7689 Other specified diseases of liver: Secondary | ICD-10-CM | POA: Diagnosis not present

## 2016-06-28 DIAGNOSIS — Z87891 Personal history of nicotine dependence: Secondary | ICD-10-CM | POA: Diagnosis not present

## 2016-06-28 LAB — COMPREHENSIVE METABOLIC PANEL
ALT: 54 U/L (ref 14–54)
AST: 77 U/L — AB (ref 15–41)
Albumin: 2.6 g/dL — ABNORMAL LOW (ref 3.5–5.0)
Alkaline Phosphatase: 110 U/L (ref 38–126)
Anion gap: 4 — ABNORMAL LOW (ref 5–15)
BILIRUBIN TOTAL: 1.7 mg/dL — AB (ref 0.3–1.2)
BUN: 12 mg/dL (ref 6–20)
CHLORIDE: 111 mmol/L (ref 101–111)
CO2: 23 mmol/L (ref 22–32)
CREATININE: 0.58 mg/dL (ref 0.44–1.00)
Calcium: 7.5 mg/dL — ABNORMAL LOW (ref 8.9–10.3)
Glucose, Bld: 122 mg/dL — ABNORMAL HIGH (ref 65–99)
POTASSIUM: 3.1 mmol/L — AB (ref 3.5–5.1)
Sodium: 138 mmol/L (ref 135–145)
TOTAL PROTEIN: 5.8 g/dL — AB (ref 6.5–8.1)

## 2016-06-28 LAB — CBC
HCT: 25.3 % — ABNORMAL LOW (ref 36.0–46.0)
Hemoglobin: 8.5 g/dL — ABNORMAL LOW (ref 12.0–15.0)
MCH: 30.4 pg (ref 26.0–34.0)
MCHC: 33.6 g/dL (ref 30.0–36.0)
MCV: 90.4 fL (ref 78.0–100.0)
PLATELETS: 97 10*3/uL — AB (ref 150–400)
RBC: 2.8 MIL/uL — AB (ref 3.87–5.11)
RDW: 13.5 % (ref 11.5–15.5)
WBC: 11.5 10*3/uL — AB (ref 4.0–10.5)

## 2016-06-28 LAB — LIPASE, BLOOD: Lipase: 20 U/L (ref 11–51)

## 2016-06-28 LAB — MRSA PCR SCREENING: MRSA by PCR: NEGATIVE

## 2016-06-28 LAB — PROTIME-INR
INR: 1.28
Prothrombin Time: 16.1 seconds — ABNORMAL HIGH (ref 11.4–15.2)

## 2016-06-28 LAB — GLUCOSE, CAPILLARY: Glucose-Capillary: 107 mg/dL — ABNORMAL HIGH (ref 65–99)

## 2016-06-28 LAB — AMMONIA: AMMONIA: 30 umol/L (ref 9–35)

## 2016-06-28 LAB — LACTIC ACID, PLASMA
LACTIC ACID, VENOUS: 1.3 mmol/L (ref 0.5–1.9)
LACTIC ACID, VENOUS: 1.2 mmol/L (ref 0.5–1.9)

## 2016-06-28 LAB — PROCALCITONIN: PROCALCITONIN: 0.44 ng/mL

## 2016-06-28 LAB — APTT: APTT: 31 s (ref 24–36)

## 2016-06-28 LAB — MAGNESIUM: Magnesium: 1.3 mg/dL — ABNORMAL LOW (ref 1.7–2.4)

## 2016-06-28 MED ORDER — SODIUM CHLORIDE 0.9 % IV SOLN
INTRAVENOUS | Status: DC
  Administered 2016-06-28 – 2016-07-01 (×5): via INTRAVENOUS

## 2016-06-28 MED ORDER — SODIUM CHLORIDE 0.9 % IV BOLUS (SEPSIS)
1000.0000 mL | Freq: Once | INTRAVENOUS | Status: AC
  Administered 2016-06-28: 1000 mL via INTRAVENOUS

## 2016-06-28 MED ORDER — DEXTROSE 5 % IV SOLN: 1.0000 g | INTRAVENOUS | Status: DC

## 2016-06-28 MED ORDER — ADULT MULTIVITAMIN W/MINERALS CH
1.0000 | ORAL_TABLET | Freq: Every day | ORAL | Status: DC
  Administered 2016-06-28 – 2016-07-02 (×5): 1 via ORAL
  Filled 2016-06-28 (×5): qty 1

## 2016-06-28 MED ORDER — POTASSIUM CHLORIDE 20 MEQ/15ML (10%) PO SOLN
20.0000 meq | Freq: Once | ORAL | Status: AC
  Administered 2016-06-28: 20 meq via ORAL
  Filled 2016-06-28: qty 15

## 2016-06-28 MED ORDER — ONDANSETRON HCL 4 MG/2ML IJ SOLN: 4.0000 mg | Freq: Three times a day (TID) | INTRAMUSCULAR | Status: DC | PRN

## 2016-06-28 MED ORDER — DEXTROSE 5 % IV SOLN
2.0000 g | INTRAVENOUS | Status: DC
  Administered 2016-06-29 – 2016-07-02 (×4): 2 g via INTRAVENOUS
  Filled 2016-06-28 (×4): qty 2

## 2016-06-28 MED ORDER — QUETIAPINE FUMARATE 25 MG PO TABS
50.0000 mg | ORAL_TABLET | Freq: Two times a day (BID) | ORAL | Status: DC
  Administered 2016-06-28 – 2016-07-02 (×10): 50 mg via ORAL
  Filled 2016-06-28 (×7): qty 2
  Filled 2016-06-28: qty 1
  Filled 2016-06-28 (×2): qty 2

## 2016-06-28 MED ORDER — ZINC OXIDE 40 % EX OINT
1.0000 "application " | TOPICAL_OINTMENT | Freq: Every day | CUTANEOUS | Status: DC | PRN
  Filled 2016-06-28: qty 114

## 2016-06-28 MED ORDER — ENOXAPARIN SODIUM 40 MG/0.4ML ~~LOC~~ SOLN
40.0000 mg | SUBCUTANEOUS | Status: DC
  Administered 2016-06-28 – 2016-07-02 (×5): 40 mg via SUBCUTANEOUS
  Filled 2016-06-28 (×5): qty 0.4

## 2016-06-28 MED ORDER — IBUPROFEN 100 MG/5ML PO SUSP
200.0000 mg | Freq: Four times a day (QID) | ORAL | Status: DC | PRN
  Administered 2016-06-28 (×2): 200 mg via ORAL
  Filled 2016-06-28 (×3): qty 10

## 2016-06-28 MED ORDER — POTASSIUM CHLORIDE 10 MEQ/100ML IV SOLN
10.0000 meq | INTRAVENOUS | Status: AC
  Administered 2016-06-28 (×4): 10 meq via INTRAVENOUS
  Filled 2016-06-28 (×5): qty 100

## 2016-06-28 MED ORDER — SODIUM CHLORIDE 0.9% FLUSH
3.0000 mL | Freq: Two times a day (BID) | INTRAVENOUS | Status: DC
  Administered 2016-06-29 – 2016-06-30 (×2): 3 mL via INTRAVENOUS

## 2016-06-28 MED ORDER — LACTULOSE 10 GM/15ML PO SOLN
30.0000 g | Freq: Once | ORAL | Status: AC
  Administered 2016-06-28: 30 g via ORAL
  Filled 2016-06-28: qty 45

## 2016-06-28 NOTE — H&P (Addendum)
History and Physical    Cathy Blackwell ZOX:[id number] DOB: January 24, 1919 DOA: 06/27/2016  Referring MD/NP/PA:   PCP: No PCP Per Patient   Patient coming from:  [facility]  At baseline, pt is dependent for her ADL.   Chief Complaint: AMS and fall  HPI: Cathy Blackwell is a 82 y.o. female with medical history significant of dementia, hypertension, urinary incontinence, diverticulitis, depression, osteoarthritis, who presents with altered mental status, fall.  Per pt's daughter, pt has dementia, but more confused than her baseline. Patient is agitated. She fell out of her wheelchair when trying to get oranges yesterday evening. She injured her head, causing minor bruises over frontal areas and nose. Patient does not complain of pain. She has urinary incontinence, not sure if she has symptoms of UTI. Per her daughter, patient does not seem to have chest pain, cough, shortness of breath, nausea, vomiting or diarrhea. She moves all extremities.  ED Course: pt was found to have WBC 15.6, -1.42, positive urinalysis with small amount of leukocytes and positive nitrates, temperature 102, electrolytes and renal function okay. Abnormal liver function with ALT 72, AST 110, total bilirubin 2.3, ALP 143. CT head is negative for acute intracranial abnormalities. CT-C-spine has no acute bony fracture, but showed degenerative disc disease. Pt is placed on tele bed for obs.  Review of Systems: Could not be reviewed due to dementia and altered mental status  Allergy: No Known Allergies  Past Medical History:  Diagnosis Date  . Dementia   . Diverticulitis   . Fracture of tibial plateau, closed 07/14/2012  . Hypertension   . Incontinence of urine   . Osteoarthritis     Past Surgical History:  Procedure Laterality Date  . APPENDECTOMY    . COLON SURGERY     Part  of colon removed  . I&D EXTREMITY Left 03/02/2013   Procedure: IRRIGATION AND DEBRIDEMENT EXTREMITY;  Surgeon:  Johnette Abraham, MD;  Location: [facility];  Service: Plastics;  Laterality: Left;  liter saline 14 g angio cath    Social History:  reports that she quit smoking about 46 years ago. She quit after 10.00 years of use. She has never used smokeless tobacco. She reports that she does not drink alcohol or use drugs.  Family History: Could not be reviewed due to dementia and altered mental status  Prior to Admission medications   Medication Sig Start Date End Date Taking? Authorizing Provider  liver oil-zinc oxide (DESITIN) 40 % ointment Apply 1 application topically daily as needed for irritation.   Yes Historical Provider, MD  Multiple Vitamin (MULTIVITAMIN WITH MINERALS) TABS Take 1 tablet by mouth daily. 03/05/13  Yes Kathlen Mody, MD  QUEtiapine (SEROQUEL) 50 MG tablet Take 50 mg by mouth 2 (two) times daily.   Yes Historical Provider, MD  cephALEXin (KEFLEX) 500 MG capsule Take 1 capsule (500 mg total) by mouth 4 (four) times daily. Patient not taking: Reported on 06/27/2016 03/11/16   [facility] Ward, PA-C  feeding supplement (ENSURE COMPLETE) LIQD Take 237 mLs by mouth 2 (two) times daily between meals. Patient not taking: Reported on 03/11/2016 03/05/13   Kathlen Mody, MD    Physical Exam: Vitals:   06/27/16 2345 06/28/16 0000 06/28/16 0109 06/28/16 0230  BP:   (!) 108/44 (!) 85/72  Pulse: 73 68 72 64  Resp:   19   Temp:    98.6 F (37 C)  TempSrc:    Oral  SpO2: 94% 93%  100% 97%  Weight:      Height:       General: Not in acute distress HEENT:       Eyes: PERRL, EOMI, no scleral icterus.       ENT: No discharge from the ears and nose, no pharynx injection, no tonsillar enlargement.        Neck: No JVD, no bruit, no mass felt. Heme: No neck lymph node enlargement. Cardiac: S1/S2, RRR, No murmurs, No gallops or rubs. Pulm:  No rales, wheezing, rhonchi or rubs. Abd: Soft, nondistended, nontender, no rebound pain, no organomegaly, BS present. GU: No hematuria Ext: No pitting leg  edema bilaterally. 2+DP/PT pulse bilaterally. Musculoskeletal: No joint deformities, No joint redness or warmth, no limitation of ROM in spin. Skin: has minor bruises over bilateral frontal areas and nose Neuro: Alert, not oriented X3, cranial nerves II-XII grossly intact, moves all extremities normally. Psych: Could not be reviewed due to altered mental status and dementia.  Labs on Admission: I have personally reviewed following labs and imaging studies  CBC:  Recent Labs Lab 06/27/16 2153  WBC 15.6*  NEUTROABS 12.6*  HGB 9.8*  HCT 28.6*  MCV 88.8  PLT 111*   Basic Metabolic Panel:  Recent Labs Lab 06/27/16 2153  NA 133*  K 3.3*  CL 102  CO2 24  GLUCOSE 133*  BUN 11  CREATININE 0.59  CALCIUM 8.2*   GFR: Estimated Creatinine Clearance: 32.3 mL/min (by C-G formula based on SCr of 0.8 mg/dL). Liver Function Tests:  Recent Labs Lab 06/27/16 2153  AST 110*  ALT 72*  ALKPHOS 143*  BILITOT 2.3*  PROT 7.1  ALBUMIN 3.1*    Recent Labs Lab 06/28/16 0226  LIPASE 20   No results for input(s): AMMONIA in the last 168 hours. Coagulation Profile:  Recent Labs Lab 06/28/16 0226  INR 1.28   Cardiac Enzymes: No results for input(s): CKTOTAL, CKMB, CKMBINDEX, TROPONINI in the last 168 hours. BNP (last 3 results) No results for input(s): PROBNP in the last 8760 hours. HbA1C: No results for input(s): HGBA1C in the last 72 hours. CBG: No results for input(s): GLUCAP in the last 168 hours. Lipid Profile: No results for input(s): CHOL, HDL, LDLCALC, TRIG, CHOLHDL, LDLDIRECT in the last 72 hours. Thyroid Function Tests: No results for input(s): TSH, T4TOTAL, FREET4, T3FREE, THYROIDAB in the last 72 hours. Anemia Panel: No results for input(s): VITAMINB12, FOLATE, FERRITIN, TIBC, IRON, RETICCTPCT in the last 72 hours. Urine analysis:    Component Value Date/Time   COLORURINE AMBER (A) 06/27/2016 2305   APPEARANCEUR CLOUDY (A) 06/27/2016 2305   LABSPEC 1.016  06/27/2016 2305   PHURINE 7.5 06/27/2016 2305   GLUCOSEU NEGATIVE 06/27/2016 2305   HGBUR NEGATIVE 06/27/2016 2305   BILIRUBINUR NEGATIVE 06/27/2016 2305   KETONESUR NEGATIVE 06/27/2016 2305   PROTEINUR NEGATIVE 06/27/2016 2305   UROBILINOGEN 0.2 03/02/2013 1930   NITRITE POSITIVE (A) 06/27/2016 2305   LEUKOCYTESUR SMALL (A) 06/27/2016 2305   Sepsis Labs: @LABRCNTIP (procalcitonin:4,lacticidven:4) ) Recent Results (from the past 240 hour(s))  MRSA PCR Screening     Status: None   Collection Time: 06/28/16  2:34 AM  Result Value Ref Range Status   MRSA by PCR NEGATIVE NEGATIVE Final    Comment:        The GeneXpert MRSA Assay (FDA approved for NASAL specimens only), is one component of a comprehensive MRSA colonization surveillance program. It is not intended to diagnose MRSA infection nor to guide or monitor treatment for MRSA  infections.      Radiological Exams on Admission: Dg Chest 2 View  Result Date: 06/27/2016 CLINICAL DATA:  82 year old female with 2 falls today. Fever. Initial encounter. EXAM: CHEST  2 VIEW COMPARISON:  Chest radiographs 01/22/2013. FINDINGS: Lung volumes have not significantly changed. Stable cardiomegaly and mediastinal contours. Calcified aortic atherosclerosis. Bilateral chronic pulmonary interstitial markings are stable. No superimposed pneumothorax, pleural effusion, pulmonary edema or acute pulmonary opacity. No acute osseous abnormality identified. IMPRESSION: No acute cardiopulmonary abnormality. Calcified aortic atherosclerosis. Electronically Signed   By: Odessa FlemingH  Hall M.D.   On: 06/27/2016 20:36   Ct Head Wo Contrast  Result Date: 06/27/2016 CLINICAL DATA:  Dementia. Fall from wheelchair. Abrasion on forehead. EXAM: CT HEAD WITHOUT CONTRAST CT CERVICAL SPINE WITHOUT CONTRAST TECHNIQUE: Multidetector CT imaging of the head and cervical spine was performed following the standard protocol without intravenous contrast. Multiplanar CT image  reconstructions of the cervical spine were also generated. COMPARISON:  03/11/2016 FINDINGS: CT HEAD FINDINGS Small remote lacunar infarct in the head of the right caudate nucleus. Otherwise, the brainstem, cerebellum, cerebral peduncles, thalami, basal ganglia, basilar cisterns, and ventricular system appear within normal limits. Periventricular white matter and corona radiata hypodensities favor chronic ischemic microvascular white matter disease. No intracranial hemorrhage, mass lesion, or acute CVA. Minimal frothy material in the left sphenoid sinus suggesting mild chronic sinusitis. CT CERVICAL SPINE FINDINGS Despite efforts by the technologist and patient, motion artifact is present on today's exam and could not be eliminated. This reduces exam sensitivity and specificity. Calcified pannus posterior to the odontoid measures 6 mm in AP dimension. 2.5 mm degenerative anterolisthesis at C3-4 and at C7-T1. Loss of disc height at C4-5, C5-6, and C6-7. No prevertebral soft tissue swelling. Degenerative findings at both temporomandibular joints. Bilateral small C7 cervical ribs. No cervical spine fracture observed. There is evidence of osseous foraminal stenosis on the right at C5-6 and C4-5, and on the left at C5-6, due to uncinate and facet spurring. IMPRESSION: 1. No acute intracranial findings are acute cervical spine findings. 2. Cervical spondylosis causing foraminal impingement at C4-5 and C5-6. 3. Small remote lacunar infarct in the head of the right caudate nucleus. 4. Periventricular white matter and corona radiata hypodensities favor chronic ischemic microvascular white matter disease. 5. Minimal chronic sphenoid sinusitis. 6. Calcified pannus posterior to the odontoid. 7. Small bilateral C7 cervical ribs. Electronically Signed   By: Gaylyn RongWalter  Liebkemann M.D.   On: 06/27/2016 21:07   Ct Cervical Spine Wo Contrast  Result Date: 06/27/2016 CLINICAL DATA:  Dementia. Fall from wheelchair. Abrasion on  forehead. EXAM: CT HEAD WITHOUT CONTRAST CT CERVICAL SPINE WITHOUT CONTRAST TECHNIQUE: Multidetector CT imaging of the head and cervical spine was performed following the standard protocol without intravenous contrast. Multiplanar CT image reconstructions of the cervical spine were also generated. COMPARISON:  03/11/2016 FINDINGS: CT HEAD FINDINGS Small remote lacunar infarct in the head of the right caudate nucleus. Otherwise, the brainstem, cerebellum, cerebral peduncles, thalami, basal ganglia, basilar cisterns, and ventricular system appear within normal limits. Periventricular white matter and corona radiata hypodensities favor chronic ischemic microvascular white matter disease. No intracranial hemorrhage, mass lesion, or acute CVA. Minimal frothy material in the left sphenoid sinus suggesting mild chronic sinusitis. CT CERVICAL SPINE FINDINGS Despite efforts by the technologist and patient, motion artifact is present on today's exam and could not be eliminated. This reduces exam sensitivity and specificity. Calcified pannus posterior to the odontoid measures 6 mm in AP dimension. 2.5 mm degenerative anterolisthesis at C3-4 and  at C7-T1. Loss of disc height at C4-5, C5-6, and C6-7. No prevertebral soft tissue swelling. Degenerative findings at both temporomandibular joints. Bilateral small C7 cervical ribs. No cervical spine fracture observed. There is evidence of osseous foraminal stenosis on the right at C5-6 and C4-5, and on the left at C5-6, due to uncinate and facet spurring. IMPRESSION: 1. No acute intracranial findings are acute cervical spine findings. 2. Cervical spondylosis causing foraminal impingement at C4-5 and C5-6. 3. Small remote lacunar infarct in the head of the right caudate nucleus. 4. Periventricular white matter and corona radiata hypodensities favor chronic ischemic microvascular white matter disease. 5. Minimal chronic sphenoid sinusitis. 6. Calcified pannus posterior to the odontoid.  7. Small bilateral C7 cervical ribs. Electronically Signed   By: Gaylyn Rong M.D.   On: 06/27/2016 21:07     EKG: Not done in ED, will get one.   Assessment/Plan Principal Problem:   UTI (lower urinary tract infection) Active Problems:   Depression   Abnormal liver function   Sepsis (HCC)   Acute encephalopathy   Fall   Hypokalemia  UTI and sepsis: Patient has positive urinalysis and worsening mental status, will treat patient as UTI. Pt meets criteria for sepsis with leukocytosis and fever on admission. Lactate is normal. Hemodynamically stable. Hemodynamically stable.   - will place on tele bed for obs - Ceftriaxone by IV - Follow up results of urine and blood cx and amend antibiotic regimen if needed per sensitivity results - prn Zofran for nausea - will get Procalcitonin and trend lactic acid levels  - IVF: 2L of NS bolus in ED, followed by 75 cc/h  Acute encephalopathy: Likely due to UTI. Potential differential diagnosis is hepatic encephalopathy given abnormal liver function. -Treat UTI as above -Given 1 dose of lactulose 30 g 1 empirically -Check ammonia level  Abnormal liver function: AST 110, ALT 72, total bilirubin 2.3, ALP 143. Etiology is not clear. Patient is not taking Tylenol or drinking alcohol. -Abdominal ultrasound -Hepatitis panel  Depression:  -Continue home medications: Seroquel  Hypokalemia: K= 3.3 on admission. - Repleted - Check Mg level  Fall: CT head and C-spine has no acute issues. -PT/OT   DVT ppx: SQ Lovenox Code Status: DNR Family Communication: None at bed side.   Disposition Plan:  Anticipate discharge back to previous SNF environment Consults called:  none Admission status: Obs / tele     Date of Service 06/28/2016    Lorretta Harp [facility] Pager [phone]  If 7PM-7AM, please contact night-coverage [email or web address] Password [email or web address] 06/28/2016, 6:01 AM

## 2016-06-28 NOTE — Progress Notes (Signed)
Initial Nutrition Assessment  DOCUMENTATION CODES:   Not applicable  INTERVENTION:  -RD to continue to monitor  NUTRITION DIAGNOSIS:   Inadequate oral intake related to inability to eat as evidenced by NPO status.  GOAL:   Patient will meet greater than or equal to 90% of their needs  MONITOR:   PO intake, I & O's, Diet advancement, Weight trends, Labs  REASON FOR ASSESSMENT:   Malnutrition Screening Tool   ASSESSMENT:   Cathy Blackwell is a 82 y.o. female with medical history significant of dementia, hypertension, urinary incontinence, diverticulitis, depression, osteoarthritis, who presents with altered mental status, fall  Spoke with Cathy Blackwell's family at bedside. They are unsure of patient's diet hx PTA. Pt came from [facility]. NFPE completed, pt exhibits mild fat depletions, muscle and edema WNL for age. Pt is lethargic, has dementia exacerbated by UTI at this time. They did have pt drinking Boost at some point but pt has not had in a while. No hx of choking/swallowing issues. No nausea/vomiting at this time.  Does not appear to be malnourished at this time. Monitor PO intake with diet advancement.  Labs and Medications reviewed: K 3.1, Ca 7.5, Mg 1.3, Albumin 2.6, Tot bili 1.7 Multivitamin with Minerals, Seroquel NS @ 3275mL/hr  Diet Order:  Diet NPO time specified Except for: Sips with Meds  Skin:  Reviewed, no issues  Last BM:  PTA  Height:   Ht Readings from Last 1 Encounters:  06/27/16 5\' 1"  (1.549 m)    Weight:   Wt Readings from Last 1 Encounters:  06/27/16 105 lb (47.6 kg)    Ideal Body Weight:  47.72 kg  BMI:  Body mass index is 19.84 kg/m.  Estimated Nutritional Needs:   Kcal:  1200-1450 calories  Protein:  50-60 grams  Fluid:  >/= 1.2L  EDUCATION NEEDS:   No education needs identified at this time  Dionne AnoWilliam M. Jerald Villalona, MS, RD LDN Inpatient Clinical Dietitian Pager [phone]

## 2016-06-28 NOTE — Progress Notes (Signed)
PROGRESS NOTE  Cathy Blackwell ZOX:[id number] DOB: 05/05/1919 DOA: 06/27/2016 PCP: No PCP Per Patient  Brief Narrative: 82 year old woman with history of dementia who fell out of wheelchair and trying to get oranges at facility, presented to the emergency department with confusion, fever, leukocytosis, abnormal urinalysis, elevated LFTs. Admitted for UTI with sepsis, acute encephalopathy, abnormal LFTs.  Assessment/Plan: 1. Fever, elevated LFTs, possible cholecystitis, resolving choledocholithiasis. Cannot exclude UTI at this point but urinalysis equivocal. Urine culture pending. Lipase normal. LFTs and leukocytosis trending down. Serum ammonia normal. Chest x-ray no acute disease. CT head and neck no acute disease. Lactic acid normal. 2. Acute encephalopathy, appears to be at baseline at this point per family at bedside. 3. Fall prior to admission. Suspect mechanical. CT head and neck no acute injury. No evidence of sequela. 4. Aortic atherosclerosis 5. Chronic thrombocytopenia of unknown etiology, stable 6. Chronic normocytic anemia of unknown etiology, appears to be at baseline. 7. Dementia, requires assistance with all ADLs except eating, advanced dementia, typically does not speak at baseline   Overall appears stable, stable hemodynamics at this point. No evidence of sepsis. Her exam is benign. Imaging and laboratory studies suggest acute cholecystitis and resolving choledocholithiasis. I don't think MRI at much as this point. Discussed in detail with healthcare power of attorney/daughter at bedside suspected diagnoses, definitive treatment would be further evaluation with imaging, possibly MRCP as well as surgical consultation for consideration of cholecystectomy. Family does not wish to pursue MR or surgery which I think is reasonable and desires a conservative course with IV antibiotics, with which I agree at this point. Will continue IV antibiotics and reassess in the morning,  Replace  potassium  DVT prophylaxis: Lovenox Code Status: DNR Family Communication: Catalina GravelBrenda Ryals at bedside [facility]  [name] Goodrich, MD  [facility] Direct contact: [phone] --Via amion app OR  --[email or web address]; password [email or web address]  7PM-7AM contact night coverage as above 06/28/2016, 1:40 PM  LOS: 0 days   Consultants:    Procedures:    Antimicrobials:  Ceftriaxone 8/14 >>  HPI/Subjective: Rarely talks per family. Follows simple commands but hx not obtainable.  Objective: Vitals:   06/28/16 0000 06/28/16 0109 06/28/16 0230 06/28/16 0611  BP:  (!) 108/44 (!) 85/72 (!) 93/43  Pulse: 68 72 64 63  Resp:  19 20 20   Temp:   98.6 F (37 C) 98.6 F (37 C)  TempSrc:   Oral Oral  SpO2: 93% 100% 97% 97%  Weight:      Height:        Intake/Output Summary (Last 24 hours) at 06/28/16 1340 Last data filed at 06/28/16 1005  Gross per 24 hour  Intake          2261.25 ml  Output              650 ml  Net          1611.25 ml     Filed Weights   06/27/16 2116  Weight: 47.6 kg (105 lb)    Exam:    Constitutional:  . Appears calm and comfortable. Opens eyes to voice and follows simple commands Respiratory:  . CTA bilaterally, no w/r/r.  . Respiratory effort normal. No retractions or accessory muscle use Cardiovascular:  . RRR, no m/r/g . No LE extremity edema   . Telemetry SR Abdomen:  . Abdomen appears normal; no tenderness or masses, no RUQ pain . No hernias noted Psychiatric:  . judgement and insight appear normal .  Mental status o Nonverbal at baseline  I have personally reviewed following labs and imaging studies:  Right upper quadrant ultrasound noted  Alkaline phosphatase, AST, ALT, total bilirubin trending down  Serum ammonia normal  WBC trending down  Hemoglobin down to 8.5, likely dilution  Scheduled Meds: . [START ON 06/29/2016] cefTRIAXone (ROCEPHIN)  IV  2 g Intravenous Q24H  . enoxaparin (LOVENOX)  injection  40 mg Subcutaneous Q24H  . multivitamin with minerals  1 tablet Oral Daily  . potassium chloride  10 mEq Intravenous Q1 Hr x 4  . QUEtiapine  50 mg Oral BID  . sodium chloride flush  3 mL Intravenous Q12H   Continuous Infusions: . sodium chloride 75 mL/hr at 06/28/16 0304    Principal Problem:   Cholecystitis, acute Active Problems:   UTI (lower urinary tract infection)   Abnormal liver function   Acute encephalopathy   Fall   Hypokalemia   LOS: 0 days   Time spent 25 minutes

## 2016-06-28 NOTE — ED Provider Notes (Signed)
[facility]-EMERGENCY DEPT Provider Note   CSN: [id number] Arrival date & time: 06/27/16  1901  First Provider Contact:  None       History   Chief Complaint Chief Complaint  Patient presents with  . Fall    HPI Cathy Blackwell is a 82 y.o. female.  82 year old female with past medical history including dementia, hypertension who presents with fall and agitation. History obtained from the patient's daughter. The patient was brought in by EMS after falling out of her wheelchair trying to get oranges this evening. Daughter states that she has been more agitated and confused than usual here in the ED. Daughter states that the patient has not had any recent cough or vomiting/diarrhea illness. Patient has not complained of pain but usually has no complaints.  LEVEL 5 CAVEAT DUE TO DEMENTIA   The history is provided by a relative.  Fall     Past Medical History:  Diagnosis Date  . Dementia   . Diverticulitis   . Fracture of tibial plateau, closed 07/14/2012  . Hypertension   . Incontinence of urine   . Osteoarthritis     Patient Active Problem List   Diagnosis Date Noted  . Swelling of joint, wrist, left 03/02/2013  . Leucocytosis 03/02/2013  . Fracture of tibial plateau, closed 07/14/2012  . Urinary tract bacterial infections 07/14/2012    Past Surgical History:  Procedure Laterality Date  . APPENDECTOMY    . COLON SURGERY     Part  of colon removed  . I&D EXTREMITY Left 03/02/2013   Procedure: IRRIGATION AND DEBRIDEMENT EXTREMITY;  Surgeon: Johnette Abraham, MD;  Location: [facility];  Service: Plastics;  Laterality: Left;  liter saline 14 g angio cath    OB History    No data available       Home Medications    Prior to Admission medications   Medication Sig Start Date End Date Taking? Authorizing Provider  liver oil-zinc oxide (DESITIN) 40 % ointment Apply 1 application topically daily as needed for irritation.   Yes Historical Provider, MD  Multiple Vitamin  (MULTIVITAMIN WITH MINERALS) TABS Take 1 tablet by mouth daily. 03/05/13  Yes Kathlen Mody, MD  QUEtiapine (SEROQUEL) 50 MG tablet Take 50 mg by mouth 2 (two) times daily.   Yes Historical Provider, MD  cephALEXin (KEFLEX) 500 MG capsule Take 1 capsule (500 mg total) by mouth 4 (four) times daily. Patient not taking: Reported on 06/27/2016 03/11/16   [facility], PA-C  feeding supplement (ENSURE COMPLETE) LIQD Take 237 mLs by mouth 2 (two) times daily between meals. Patient not taking: Reported on 03/11/2016 03/05/13   Kathlen Mody, MD    Family History No family history on file.  Social History Social History  Substance Use Topics  . Smoking status: Former Smoker    Years: 10.00    Quit date: 07/13/1969  . Smokeless tobacco: Never Used  . Alcohol use No     Comment: quit drinking in late 1970's - "was a heavy drinker at one time"     Allergies   Review of patient's allergies indicates no known allergies.   Review of Systems Review of Systems  Unable to perform ROS: Dementia     Physical Exam Updated Vital Signs BP 113/70   Pulse 76   Temp 102 F (38.9 C) (Rectal)   Resp 20   Ht 5\' 1"  (1.549 m)   Wt 105 lb (47.6 kg)   SpO2 97%   BMI 19.84  kg/m   Physical Exam  Constitutional: She appears well-developed and well-nourished. No distress.  Frail, elderly woman, awake and alert  HENT:  Head: Normocephalic.  Abrasion middle of forehead, nose; Moist mucous membranes  Eyes: Conjunctivae are normal. Pupils are equal, round, and reactive to light.  Neck: Neck supple.  Cardiovascular: Normal rate, regular rhythm and normal heart sounds.   No murmur heard. Pulmonary/Chest: Effort normal and breath sounds normal.  Abdominal: Soft. Bowel sounds are normal. She exhibits no distension. There is no tenderness.  Musculoskeletal: She exhibits no tenderness or deformity.  Neurological:  Awake, alert, able to follow basic commands but unable to answer questions appropriately;  moving all 4 ext  Skin: Skin is warm and dry. No rash noted.  Nursing note and vitals reviewed.    ED Treatments / Results  Labs (all labs ordered are listed, but only abnormal results are displayed) Labs Reviewed  COMPREHENSIVE METABOLIC PANEL - Abnormal; Notable for the following:       Result Value   Sodium 133 (*)    Potassium 3.3 (*)    Glucose, Bld 133 (*)    Calcium 8.2 (*)    Albumin 3.1 (*)    AST 110 (*)    ALT 72 (*)    Alkaline Phosphatase 143 (*)    Total Bilirubin 2.3 (*)    All other components within normal limits  CBC WITH DIFFERENTIAL/PLATELET - Abnormal; Notable for the following:    WBC 15.6 (*)    RBC 3.22 (*)    Hemoglobin 9.8 (*)    HCT 28.6 (*)    Platelets 111 (*)    Neutro Abs 12.6 (*)    Monocytes Absolute 1.9 (*)    All other components within normal limits  URINALYSIS, ROUTINE W REFLEX MICROSCOPIC (NOT AT [facility]) - Abnormal; Notable for the following:    Color, Urine AMBER (*)    APPearance CLOUDY (*)    Nitrite POSITIVE (*)    Leukocytes, UA SMALL (*)    All other components within normal limits  URINE MICROSCOPIC-ADD ON - Abnormal; Notable for the following:    Bacteria, UA MANY (*)    All other components within normal limits  URINE CULTURE  CULTURE, BLOOD (ROUTINE X 2)  CULTURE, BLOOD (ROUTINE X 2)  I-STAT CG4 LACTIC ACID, ED    EKG  EKG Interpretation None       Radiology Dg Chest 2 View  Result Date: 06/27/2016 CLINICAL DATA:  82 year old female with 2 falls today. Fever. Initial encounter. EXAM: CHEST  2 VIEW COMPARISON:  Chest radiographs 01/22/2013. FINDINGS: Lung volumes have not significantly changed. Stable cardiomegaly and mediastinal contours. Calcified aortic atherosclerosis. Bilateral chronic pulmonary interstitial markings are stable. No superimposed pneumothorax, pleural effusion, pulmonary edema or acute pulmonary opacity. No acute osseous abnormality identified. IMPRESSION: No acute cardiopulmonary abnormality.  Calcified aortic atherosclerosis. Electronically Signed   By: Odessa Fleming M.D.   On: 06/27/2016 20:36   Ct Head Wo Contrast  Result Date: 06/27/2016 CLINICAL DATA:  Dementia. Fall from wheelchair. Abrasion on forehead. EXAM: CT HEAD WITHOUT CONTRAST CT CERVICAL SPINE WITHOUT CONTRAST TECHNIQUE: Multidetector CT imaging of the head and cervical spine was performed following the standard protocol without intravenous contrast. Multiplanar CT image reconstructions of the cervical spine were also generated. COMPARISON:  03/11/2016 FINDINGS: CT HEAD FINDINGS Small remote lacunar infarct in the head of the right caudate nucleus. Otherwise, the brainstem, cerebellum, cerebral peduncles, thalami, basal ganglia, basilar cisterns, and ventricular system appear within  normal limits. Periventricular white matter and corona radiata hypodensities favor chronic ischemic microvascular white matter disease. No intracranial hemorrhage, mass lesion, or acute CVA. Minimal frothy material in the left sphenoid sinus suggesting mild chronic sinusitis. CT CERVICAL SPINE FINDINGS Despite efforts by the technologist and patient, motion artifact is present on today's exam and could not be eliminated. This reduces exam sensitivity and specificity. Calcified pannus posterior to the odontoid measures 6 mm in AP dimension. 2.5 mm degenerative anterolisthesis at C3-4 and at C7-T1. Loss of disc height at C4-5, C5-6, and C6-7. No prevertebral soft tissue swelling. Degenerative findings at both temporomandibular joints. Bilateral small C7 cervical ribs. No cervical spine fracture observed. There is evidence of osseous foraminal stenosis on the right at C5-6 and C4-5, and on the left at C5-6, due to uncinate and facet spurring. IMPRESSION: 1. No acute intracranial findings are acute cervical spine findings. 2. Cervical spondylosis causing foraminal impingement at C4-5 and C5-6. 3. Small remote lacunar infarct in the head of the right caudate nucleus.  4. Periventricular white matter and corona radiata hypodensities favor chronic ischemic microvascular white matter disease. 5. Minimal chronic sphenoid sinusitis. 6. Calcified pannus posterior to the odontoid. 7. Small bilateral C7 cervical ribs. Electronically Signed   By: Gaylyn RongWalter  Liebkemann M.D.   On: 06/27/2016 21:07   Ct Cervical Spine Wo Contrast  Result Date: 06/27/2016 CLINICAL DATA:  Dementia. Fall from wheelchair. Abrasion on forehead. EXAM: CT HEAD WITHOUT CONTRAST CT CERVICAL SPINE WITHOUT CONTRAST TECHNIQUE: Multidetector CT imaging of the head and cervical spine was performed following the standard protocol without intravenous contrast. Multiplanar CT image reconstructions of the cervical spine were also generated. COMPARISON:  03/11/2016 FINDINGS: CT HEAD FINDINGS Small remote lacunar infarct in the head of the right caudate nucleus. Otherwise, the brainstem, cerebellum, cerebral peduncles, thalami, basal ganglia, basilar cisterns, and ventricular system appear within normal limits. Periventricular white matter and corona radiata hypodensities favor chronic ischemic microvascular white matter disease. No intracranial hemorrhage, mass lesion, or acute CVA. Minimal frothy material in the left sphenoid sinus suggesting mild chronic sinusitis. CT CERVICAL SPINE FINDINGS Despite efforts by the technologist and patient, motion artifact is present on today's exam and could not be eliminated. This reduces exam sensitivity and specificity. Calcified pannus posterior to the odontoid measures 6 mm in AP dimension. 2.5 mm degenerative anterolisthesis at C3-4 and at C7-T1. Loss of disc height at C4-5, C5-6, and C6-7. No prevertebral soft tissue swelling. Degenerative findings at both temporomandibular joints. Bilateral small C7 cervical ribs. No cervical spine fracture observed. There is evidence of osseous foraminal stenosis on the right at C5-6 and C4-5, and on the left at C5-6, due to uncinate and facet  spurring. IMPRESSION: 1. No acute intracranial findings are acute cervical spine findings. 2. Cervical spondylosis causing foraminal impingement at C4-5 and C5-6. 3. Small remote lacunar infarct in the head of the right caudate nucleus. 4. Periventricular white matter and corona radiata hypodensities favor chronic ischemic microvascular white matter disease. 5. Minimal chronic sphenoid sinusitis. 6. Calcified pannus posterior to the odontoid. 7. Small bilateral C7 cervical ribs. Electronically Signed   By: Gaylyn RongWalter  Liebkemann M.D.   On: 06/27/2016 21:07    Procedures Procedures (including critical care time)  Medications Ordered in ED Medications  acetaminophen (TYLENOL) tablet 1,000 mg (1,000 mg Oral Given 06/27/16 2143)  haloperidol lactate (HALDOL) injection 2 mg (2 mg Intravenous Given 06/27/16 2229)  sodium chloride 0.9 % bolus 1,000 mL (1,000 mLs Intravenous New Bag/Given 06/28/16 0021)  cefTRIAXone (ROCEPHIN) 1 g in dextrose 5 % 50 mL IVPB (0 g Intravenous Stopped 06/28/16 0049)     Initial Impression / Assessment and Plan / ED Course  I have reviewed the triage vital signs and the nursing notes.  Pertinent labs & imaging results that were available during my care of the patient were reviewed by me and considered in my medical decision making (see chart for details).  Clinical Course   Patient presents after a fall at her nursing facility, daughter notes increased agitation and confusion from her baseline. She was awake, confused on exam but is able to follow basic commands. Vital signs notable for fever of 102, BP 1:30/61, heart rate 86. She had abrasions on her forehead and nose but no signs of trauma on extremities or trunk. Obtained CT of head and C-spine which was negative for acute injury. Regarding agitation and increased confusion, obtained above lab work andchest x-ray.   Chest x-ray negative acute. Labs show normal lactate, normal creatinine, AST 110, ALT 72, bilirubin 2.3, WBC  15.6. UA consistent with infection, urine culture sent. Gave the patient Tylenol, IV fluid bolus, and ceftriaxone to treat UTI. I suspect that this is the source of her confusion. Because of her ongoing agitation and elevated LFTs, I discussed admission with hospitalist, Dr. Clyde LundborgNiu, and have added abdominal ultrasound to work up her elevated LFTs. Pt admitted for further treatment.  Final Clinical Impressions(s) / ED Diagnoses   Final diagnoses:  UTI (lower urinary tract infection)  Fall, initial encounter  Head contusion, initial encounter  Agitation  Elevated LFTs    New Prescriptions New Prescriptions   No medications on file     Laurence Spatesachel Morgan Ahman Dugdale, MD 06/28/16 [phone]

## 2016-06-28 NOTE — Progress Notes (Signed)
PT Cancellation Note  Patient Details Name: Cathy KalesMyrtle L Glanz MRN: [id number] DOB: 04/27/1919   Cancelled Treatment:    Reason Eval/Treat Not Completed: Medical issues which prohibited therapy   [facility] AnthemWILLIAMS,Jonothan Heberle 06/28/2016, 11:32 AM

## 2016-06-28 NOTE — ED Notes (Signed)
Gave update to Sue LushAndrea at [facility] about admission.

## 2016-06-28 NOTE — ED Notes (Signed)
[place]

## 2016-06-28 NOTE — Evaluation (Signed)
Occupational Therapy Evaluation Patient Details Name: Cathy KalesMyrtle L Blackwell MRN: [id number] DOB: 07/18/1919 Today's Date: 06/28/2016    History of Present Illness a 82 y.o. female with medical history significant of dementia, hypertension, urinary incontinence, diverticulitis, depression, osteoarthritis, who presents with altered mental status, fall.   Clinical Impression   This 82 yo female admitted with above presents to acute OT with deficits below (see OT problem list) thus affecting her safety and independence at level she was at pta ( see below). She will benefit from acute OT with follow up at SNF. Incontinence of stool is a hindering issue at present.    Follow Up Recommendations  SNF    Equipment Recommendations  Other (comment) (TBD at next venue)       Precautions / Restrictions Precautions Precautions: Fall Restrictions Weight Bearing Restrictions: No      Mobility Bed Mobility Overal bed mobility: Needs Assistance Bed Mobility: Supine to Sit;Sit to Supine     Supine to sit: Min assist;HOB elevated (trunk) Sit to supine: Min assist (legs)      Transfers Overall transfer level: Needs assistance Equipment used: 1 person hand held assist Transfers: Sit to/from [employer] CorporationStand;Stand Pivot Transfers Sit to Stand: Min assist Stand pivot transfers: Min assist            Balance Overall balance assessment: Needs assistance Sitting-balance support: Feet supported;No upper extremity supported Sitting balance-Leahy Scale: Fair     Standing balance support: Bilateral upper extremity supported;During functional activity Standing balance-Leahy Scale: Poor Standing balance comment: reliant on hands on surface for transfers                            ADL Overall ADL's : Needs assistance/impaired     Grooming: Wash/dry face;Supervision/safety;Set up;Sitting   Upper Body Bathing: Minimal assitance;Sitting   Lower Body Bathing: Maximal assistance (min A  sit<>stand)   Upper Body Dressing : Minimal assistance;Sitting (pullover)   Lower Body Dressing: Maximal assistance (min A sit<>stand)   Toilet Transfer: Minimal assistance;Stand-pivot;BSC   Toileting- Clothing Manipulation and Hygiene: Total assistance (with +1 sit<>stand and another to help with hygiene)                         Pertinent Vitals/Pain Pain Assessment: 0-10 Pain Location: right shoulder, but could not rate, only could tell me right shoulder (dtr questions whether she fell on it during her recent fall at SNF Pain Descriptors / Indicators: Sore Pain Intervention(s): Other (comment) (made RN aware that pt was c/o right shoulder pain)     Hand Dominance Right   Extremity/Trunk Assessment Upper Extremity Assessment Upper Extremity Assessment: Generalized weakness   Lower Extremity Assessment Lower Extremity Assessment: Defer to PT evaluation       Communication Communication Communication: HOH   Cognition Arousal/Alertness: Awake/alert Behavior During Therapy: WFL for tasks assessed/performed Overall Cognitive Status: History of cognitive impairments - at baseline                                Home Living Family/patient expects to be discharged to:: Skilled nursing facility Living Arrangements:  (has been at ALF the last 4 years) Available Help at Discharge:  (ALF staff) Type of Home: Assisted living Home Access: Level entry     Home Layout: One level     Bathroom Shower/Tub:  (staff help her in and out of shower  when she will allow them to give her a shower)   Bathroom Toilet: Handicapped height     Home Equipment: Wheelchair - manual   Additional Comments: this is mainly has she gets around in at ALF (uses her feet to propel)      Prior Functioning/Environment Level of Independence: Needs assistance  Gait / Transfers Assistance Needed: staff at ALF state that she mainly would get around in W/C with feet, but occassionally  she could get up and take at the most 10-15 steps holding onto furniture (has never uses a walker or cane). Usually does not call or ask for help when getting up ADL's / Homemaking Assistance Needed: per staff at ALF pt could put her shirt on once handed to her, could pull up Depends and pants once they were threaded over her feet, could wash her face once washcloth was presented, could feed herself if meal was set up correctly for her (she has cataracts and bad eye sight per dtr in room) Communication / Swallowing Assistance Needed: Per daughter pt does not talk much--during session today, pts dtr reports that that is the most she has heard her mother say in a long time. Dtr reports that her mother has been mad at her ever since she had to go into ALF 4 years ago      OT Diagnosis: Generalized weakness;Cognitive deficits;Acute pain   OT Problem List: Decreased strength;Impaired balance (sitting and/or standing);Decreased cognition;Decreased safety awareness;Decreased knowledge of use of DME or AE;Pain   OT Treatment/Interventions: Self-care/ADL training;Patient/family education;Therapeutic activities;DME and/or AE instruction;Balance training    OT Goals(Current goals can be found in the care plan section) Acute Rehab OT Goals Patient Stated Goal: did not state, but [facility]  OT Goal Formulation: With patient Time For Goal Achievement: 07/12/16 Potential to Achieve Goals: Good  OT Frequency: Min 2X/week              End of Session Nurse Communication:  (continues with watery bowels and pt c/o right shoulder pain)  Activity Tolerance: Patient tolerated treatment well Patient left: in bed;with call bell/phone within reach;with bed alarm set;with family/visitor present   Time: 1315-1413 OT Time Calculation (min): 58 min Charges:  OT General Charges $OT Visit: 1 Procedure OT Evaluation $OT Eval Moderate Complexity: 1 Procedure OT Treatments $Self Care/Home  Management : 38-52 mins  Evette GeorgesLeonard, [place] [phone] 06/28/2016, 3:29 PM

## 2016-06-29 LAB — COMPREHENSIVE METABOLIC PANEL
ALBUMIN: 2.4 g/dL — AB (ref 3.5–5.0)
ALT: 45 U/L (ref 14–54)
AST: 58 U/L — AB (ref 15–41)
Alkaline Phosphatase: 105 U/L (ref 38–126)
Anion gap: 5 (ref 5–15)
BUN: 10 mg/dL (ref 6–20)
CHLORIDE: 111 mmol/L (ref 101–111)
CO2: 21 mmol/L — AB (ref 22–32)
CREATININE: 0.63 mg/dL (ref 0.44–1.00)
Calcium: 7.9 mg/dL — ABNORMAL LOW (ref 8.9–10.3)
GFR calc Af Amer: >60 mL/min (ref >60)
GFR calc non Af Amer: >60 mL/min (ref >60)
Glucose, Bld: 76 mg/dL (ref 65–99)
POTASSIUM: 3.8 mmol/L (ref 3.5–5.1)
SODIUM: 137 mmol/L (ref 135–145)
Total Bilirubin: 0.7 mg/dL (ref 0.3–1.2)
Total Protein: 5.4 g/dL — ABNORMAL LOW (ref 6.5–8.1)

## 2016-06-29 LAB — CBC
HEMATOCRIT: 26.4 % — AB (ref 36.0–46.0)
Hemoglobin: 8.9 g/dL — ABNORMAL LOW (ref 12.0–15.0)
MCH: 30.2 pg (ref 26.0–34.0)
MCHC: 33.7 g/dL (ref 30.0–36.0)
MCV: 89.5 fL (ref 78.0–100.0)
PLATELETS: UNDETERMINED 10*3/uL (ref 150–400)
RBC: 2.95 MIL/uL — ABNORMAL LOW (ref 3.87–5.11)
RDW: 13.8 % (ref 11.5–15.5)
WBC: 7.8 10*3/uL (ref 4.0–10.5)

## 2016-06-29 LAB — HEPATITIS PANEL, ACUTE
HCV Ab: <0.1 s/co ratio (ref 0.0–0.9)
HEP A IGM: NEGATIVE
HEP B C IGM: NEGATIVE
Hepatitis B Surface Ag: NEGATIVE

## 2016-06-29 LAB — GLUCOSE, CAPILLARY: Glucose-Capillary: 87 mg/dL (ref 65–99)

## 2016-06-29 NOTE — Progress Notes (Signed)
PROGRESS NOTE  Cathy Blackwell YNW:[id number] DOB: 03/12/1919 DOA: 06/27/2016 PCP: No PCP Per PatiWilnette Blackwell  Brief Narrative: 82 year old woman with history of dementia who fell out of wheelchair and trying to get oranges at facility, presented to the emergency department with confusion, fever, leukocytosis, abnormal urinalysis, elevated LFTs. Admitted for UTI with sepsis, acute encephalopathy, abnormal LFTs.  Assessment/Plan: 1. Suspected acute cholecystitis, choledocholithiasis. LFTs trending down or normal suggesting passage of stone and resolution of biliary obstruction. On exam the patient has no right upper quadrant pain. She remains afebrile with no leukocytosis. Discussed in detail with healthcare power of attorney yesterday and plan is continue empiric antibiotics, family does not wish to consider surgery for any reason and there is no benefit to GI consultation at this point given normalization of her studies. 2. E coli UTI. Continue empiric antibiotics. Follow-up culture data. 3. Acute encephalopathy appears resolved at this point. 4. Fall prior to admission. Suspect mechanical. CT head and neck no acute injury. No evidence of sequela. 5. Aortic atherosclerosis 6. Chronic thrombocytopenia of unknown etiology, stable 7. Chronic normocytic anemia of unknown etiology, appears to be at baseline. 8. Dementia, requires assistance with all ADLs except eating, advanced dementia, typically does not speak at baseline   Overall appears stable. Will continue IV antibiotics today. Likely change to oral next 24 hours and treat for the next 7-14 days. Conservative management per family including no surgery. Further discussion in regard to outpatient goals could be of benefit when family is again present.   DVT prophylaxis: Lovenox Code Status: DNR Family Communication: Catalina GravelBrenda Ryals at bedside [facility] Plan[facility]  [name] Goodrich, MD  [facility] Direct  contact: [phone] --Via amion app OR  --[email or web address]; password [email or web address]  7PM-7AM contact night coverage as above 06/29/2016, 2:36 PM  LOS: 1 day   Consultants:    Procedures:    Antimicrobials:  Ceftriaxone 8/14 >>  HPI/Subjective: No issues overnight. Not interested in food per nursing.  Objective: Vitals:   06/28/16 1431 06/28/16 2110 06/29/16 0442 06/29/16 1351  BP: (!) 118/38 (!) 121/46 (!) 118/36 (!) 143/62  Pulse: (!) 59 (!) 52 (!) 56 63  Resp: 16 16 16 16   Temp: 98.2 F (36.8 C) 98 F (36.7 C) 97.8 F (36.6 C) 98.5 F (36.9 C)  TempSrc: Oral Oral Oral Oral  SpO2: 97% 99% 99% 99%  Weight:      Height:        Intake/Output Summary (Last 24 hours) at 06/29/16 1436 Last data filed at 06/29/16 1323  Gross per 24 hour  Intake          1596.25 ml  Output              400 ml  Net          1196.25 ml     Filed Weights   06/27/16 2116  Weight: 47.6 kg (105 lb)    Exam:    Constitutional:  . Appears calm and comfortable. Awake. Sitting up in bed. Respiratory:  . CTA bilaterally, no w/r/r.  . Respiratory effort normal. No retractions or accessory muscle use Cardiovascular:  . RRR, no m/r/g Abdomen:  . Abdomen appears normal; no tenderness or masses, no RUQ pain Psychiatric:  . Mental status o Nonverbal at baseline  I have personally reviewed following labs and imaging studies:  AST trending down, total bilirubin has normalized. Alkaline phosphatase and ALT normal.  Hemoglobin study, 8.9  Urine culture noted  Scheduled Meds: .  cefTRIAXone (ROCEPHIN)  IV  2 g Intravenous Q24H  . enoxaparin (LOVENOX) injection  40 mg Subcutaneous Q24H  . multivitamin with minerals  1 tablet Oral Daily  . QUEtiapine  50 mg Oral BID  . sodium chloride flush  3 mL Intravenous Q12H   Continuous Infusions: . sodium chloride 75 mL/hr at 06/28/16 1518    Principal Problem:   Cholecystitis, acute Active Problems:   UTI (lower urinary tract infection)    Abnormal liver function   Acute encephalopathy   Fall   Hypokalemia   LOS: 1 day   Time spent 20 minutes

## 2016-06-29 NOTE — Evaluation (Signed)
Physical Therapy Evaluation Patient Details Name: Cathy Blackwell MRN: [id number] DOB: 08/23/1919 Today's Date: 06/29/2016   History of Present Illness  a 82 y.o. female with medical history significant of dementia, hypertension, urinary incontinence, diverticulitis, depression, osteoarthritis, who presents with altered mental status, fall.  Clinical Impression  Pt admitted with above diagnosis. Pt currently with functional limitations due to the deficits listed below (see PT Problem List).  Pt will benefit from skilled PT to increase their independence and safety with mobility to allow discharge to the venue listed below.  Limited PT eval due to pt not happy about PT's presence, but moving with MIN A.     Follow Up Recommendations SNF;Supervision for mobility/OOB    Equipment Recommendations  None recommended by PT    Recommendations for Other Services       Precautions / Restrictions Precautions Precautions: Fall Restrictions Weight Bearing Restrictions: No      Mobility  Bed Mobility               General bed mobility comments: Pt sitting on BSC with nursing students present.  Transfers Overall transfer level: Needs assistance Equipment used: 1 person hand held assist Transfers: Sit to/from Stand Sit to Stand: Min assist Stand pivot transfers: Min assist          Ambulation/Gait Ambulation/Gait assistance: Min assist Ambulation Distance (Feet): 5 Feet Assistive device: 1 person hand held assist Gait Pattern/deviations: Decreased step length - right;Decreased step length - left;Trunk flexed     General Gait Details: Amb with 1 HHA in room.  Stairs            Wheelchair Mobility    Modified Rankin (Stroke Patients Only)       Balance Overall balance assessment: Needs assistance Sitting-balance support: Feet supported Sitting balance-Leahy Scale: Fair     Standing balance support: Single extremity supported Standing balance-Leahy  Scale: Poor                               Pertinent Vitals/Pain Pain Assessment: Faces Faces Pain Scale: No hurt    Home Living Family/patient expects to be discharged to:: Skilled nursing facility               Home Equipment: Wheelchair - manual Additional Comments: this is mainly has she gets around in at ALF (uses her feet to propel)    Prior Function     Gait / Transfers Assistance Needed: staff at ALF state that she mainly would get around in W/C with feet, but occassionally she could get up and take at the most 10-15 steps holding onto furniture (has never uses a walker or cane). Usually does not call or ask for help when getting up           Hand Dominance   Dominant Hand: Right    Extremity/Trunk Assessment   Upper Extremity Assessment: Defer to OT evaluation           Lower Extremity Assessment: Generalized weakness         Communication   Communication: HOH  Cognition Arousal/Alertness: Awake/alert Behavior During Therapy: Flat affect Overall Cognitive Status: History of cognitive impairments - at baseline                      General Comments General comments (skin integrity, edema, etc.): no family present today. Pt with flat affect and not happy about PT.  Exercises        Assessment/Plan    PT Assessment Patient needs continued PT services  PT Diagnosis Difficulty walking   PT Problem List Decreased strength;Decreased activity tolerance;Decreased balance;Decreased mobility  PT Treatment Interventions Gait training;Functional mobility training;Therapeutic exercise;Therapeutic activities;Balance training   PT Goals (Current goals can be found in the Care Plan section) Acute Rehab PT Goals Patient Stated Goal: not stated PT Goal Formulation: Patient unable to participate in goal setting Time For Goal Achievement: 07/13/16 Potential to Achieve Goals: Good    Frequency Min 2X/week   Barriers to discharge         Co-evaluation               End of Session   Activity Tolerance: Patient limited by fatigue Patient left: in chair;with call bell/phone within reach;with chair alarm set Nurse Communication: Mobility status         Time: [id number] PT Time Calculation (min) (ACUTE ONLY): 14 min   Charges:   PT Evaluation $PT Eval Low Complexity: 1 Procedure     PT G Codes:        Cathy Blackwell 06/29/2016, 3:14 PM

## 2016-06-30 DIAGNOSIS — K810 Acute cholecystitis: Secondary | ICD-10-CM

## 2016-06-30 LAB — URINE CULTURE

## 2016-06-30 LAB — GLUCOSE, CAPILLARY: Glucose-Capillary: 87 mg/dL (ref 65–99)

## 2016-06-30 MED ORDER — LORAZEPAM 2 MG/ML IJ SOLN
0.5000 mg | Freq: Once | INTRAMUSCULAR | Status: AC
  Administered 2016-06-30: 0.5 mg via INTRAVENOUS
  Filled 2016-06-30: qty 1

## 2016-06-30 NOTE — Progress Notes (Signed)
Patient and daughter have chosen [facility].

## 2016-06-30 NOTE — Clinical Social Work Note (Signed)
Clinical Social Work Assessment  Patient Details  Name: Cathy KalesMyrtle L Banke MRN: [id number] Date of Birth: 02/18/1919  Date of referral:  (P) 06/29/16               Reason for consult:  (P) Discharge Planning, Facility Placement                Permission sought to share information with:  (P) Case Manager, Family Supports, Magazine features editoracility Contact Representative Permission granted to share information::     Name::        Agency::     Relationship::  (P) Daughter  Contact Information:  Welton Flakes(P) [name]: [phone]: [phone], [phone]  Housing/Transportation Living arrangements for the past 2 months:  (P) Assisted Living Facility Horticulturist, commercial([facility]) Source of Information:  (P) Adult Children, Power of Attorney Patient Interpreter Needed:  (P) None Criminal Activity/Legal Involvement Pertinent to Current Situation/Hospitalization:  (P) No - Comment as needed Significant Relationships:  (P) Merchandiser, retailCommunity Support, Adult Children, Other Family Members Lives with:  (P) Facility Resident Do you feel safe going back to the place where you live?  (P) No Need for family participation in patient care:  (P) Yes (Comment)  Care giving concerns:  Daughter reports the patient has been living at [facility] for the last four years. Daughter reports recently the patient has not been eating very well. Patient does not talk she nods her head and she might say yes or no. She sits in her wheel in front of wheelchair and does not participate in activities.  The patient uses the wheel chair a baseline and manages to  get in and out of the wheel chair. The patient fell from the wheelchair and hit her head, while at ALF and was admitted to hospital. The patient daughter wants long term SNF.    Social Worker assessment / plan:  LCSWA attempted to meet with patient at bedside, pt. Sleeping therefore gathered information from daughter(POA). Daughter reports pt. Has not been managing well at ALF. The patient can feed herself but has not  been eating well. She needs assistance with bathing and dressing. Family is agreeable to Skilled nursing facility for patient care. LCSWA educated patient about applying for medicaid to supplennt medicare therefore the patient can benefit long term.   Employment status:  (P) Retired Health and safety inspectornsurance information:  (P) Medicare PT Recommendations:  (P) Skilled Nursing Facility, 24 Hour Supervision Information / Referral to community resources:  (P) Skilled Nursing Facility  Patient/Family's Response to care:  Agreeable.   Patient/Family's Understanding of and Emotional Response to Diagnosis, Current Treatment, and Prognosis: "I would like to see my mother at [facility] because I know they will take care of her. She has been there in the past for rehab.   Emotional Assessment Appearance:  (P) Appears stated age Attitude/Demeanor/Rapport:  (P) Lethargic Affect (typically observed):  (P) Quiet Orientation:  (P) Oriented to Self Alcohol / Substance use:  (P) Not Applicable Psych involvement (Current and /or in the community):  (P) No (Comment)  Discharge Needs  Concerns to be addressed:    Readmission within the last 30 days:    Current discharge risk:    Barriers to Discharge:      Clearance CootsNicole A Kenroy Timberman, LCSW 06/30/2016, 12:25 PM

## 2016-06-30 NOTE — Progress Notes (Signed)
PROGRESS NOTE  Wilnette KalesMyrtle L Belitz ZOX:[id number] DOB: 12/03/1918 DOA: 06/27/2016 PCP: No PCP Per Patient  Brief Narrative: 82 year old woman with history of dementia who fell out of wheelchair and trying to get oranges at facility, presented to the emergency department with confusion, fever, leukocytosis, abnormal urinalysis, elevated LFTs. Admitted for UTI with sepsis, acute encephalopathy, abnormal LFTs.  Assessment/Plan: 1. Suspect acute cholecystitis, choledocholithiasis. LFTs trending down or normal suggesting passage of stone and resolution of biliary obstruction. Plan is continue empiric antibiotics, family does not wish to consider surgery for any reason and there is no benefit to GI consultation at this point given normalization of her studies. 2. E coli UTI. Continue empiric antibiotics. Follow-up culture data. 3. Acute encephalopathy appears resolved at this point. 4. Fall prior to admission. Suspect mechanical. CT head and neck no acute injury. No evidence of sequela. 5. Aortic atherosclerosis 6. Chronic thrombocytopenia of unknown etiology, stable 7. Chronic normocytic anemia of unknown etiology, appears to be at baseline. 8. Dementia, requires assistance with all ADLs except eating, advanced dementia, typically does not speak at baseline   Overall appears stable. Will continue IV antibiotics today. Likely change to oral next 24 hours and treat for the next 7-14 days. Conservative management per family including no surgery. Further discussion in regard to outpatient goals could be of benefit when family is again present.   DVT prophylaxis: Lovenox Code Status: DNR Family Communication: Catalina GravelBrenda Ryals at bedside Disposition Plan: return to [facility]  Penny PiaVEGA, Damione Robideau [facility] Direct contact: [phone] --Via amion app OR  --[email or web address]; password [email or web address]  7PM-7AM contact night coverage as above 06/30/2016, 2:56 PM  LOS: 2 days    Consultants:  None  Antimicrobials:  Ceftriaxone 8/14 >>  HPI/Subjective: No acute issues reported overnight.  Objective: Vitals:   06/29/16 0442 06/29/16 1351 06/29/16 2139 06/30/16 0630  BP: (!) 118/36 (!) 143/62 (!) 123/44 101/82  Pulse: (!) 56 63 65 61  Resp: 16 16 16 15   Temp: 97.8 F (36.6 C) 98.5 F (36.9 C) 98.1 F (36.7 C) 97.8 F (36.6 C)  TempSrc: Oral Oral Oral Oral  SpO2: 99% 99% 98% 99%  Weight:      Height:        Intake/Output Summary (Last 24 hours) at 06/30/16 1456 Last data filed at 06/30/16 0921  Gross per 24 hour  Intake          2026.25 ml  Output              420 ml  Net          1606.25 ml     Filed Weights   06/27/16 2116  Weight: 47.6 kg (105 lb)    Exam:    Constitutional:  . Awake and alert and in NAD. Respiratory:  . CTA bilaterally, no w/r/r.  . Respiratory effort normal. No retractions or accessory muscle use Cardiovascular:  . RRR, no m/r/g Abdomen:  . Abdomen appears normal; no tenderness or masses, no RUQ pain Psychiatric:  . Mental status o Nonverbal at baseline  I have personally reviewed following labs and imaging studies:  AST trending down, total bilirubin has normalized. Alkaline phosphatase and ALT normal.  Hemoglobin study, 8.9  Urine culture noted  Scheduled Meds: . cefTRIAXone (ROCEPHIN)  IV  2 g Intravenous Q24H  . enoxaparin (LOVENOX) injection  40 mg Subcutaneous Q24H  . multivitamin with minerals  1 tablet Oral Daily  . QUEtiapine  50 mg Oral BID  . sodium chloride flush  3 mL Intravenous Q12H   Continuous Infusions: . sodium chloride 75 mL/hr at 06/29/16 2132    Principal Problem:   Cholecystitis, acute Active Problems:   UTI (lower urinary tract infection)   Abnormal liver function   Acute encephalopathy   Fall   Hypokalemia   LOS: 2 days   Time spent 30 minutes

## 2016-06-30 NOTE — NC FL2 (Signed)
[place] MEDICAID FL2 LEVEL OF CARE SCREENING TOOL     IDENTIFICATION  Patient Name: Cathy Blackwell Birthdate: 10/01/1919 Sex: female Admission Date (Current Location): 06/27/2016  [facility] [place]   Facility and Address:  [facility] [place], [address], [place], [place] [address]      Provider Number: [id number]  Attending Physician Name and Address:  Penny Piarlando Vega, MD  Relative Name and Phone Number:       Current Level of Care: Hospital Recommended Level of Care: Skilled Nursing Facility Prior Approval Number:    Date Approved/Denied:   PASRR Number:    Discharge Plan: SNF    Current Diagnoses: Patient Active Problem List   Diagnosis Date Noted  . UTI (lower urinary tract infection) 06/28/2016  . Depression 06/28/2016  . Abnormal liver function 06/28/2016  . Acute encephalopathy 06/28/2016  . Fall 06/28/2016  . Hypokalemia 06/28/2016  . Cholecystitis, acute 06/28/2016  . Agitation   . Swelling of joint, wrist, left 03/02/2013  . Leucocytosis 03/02/2013  . Fracture of tibial plateau, closed 07/14/2012  . Urinary tract bacterial infections 07/14/2012    Orientation RESPIRATION BLADDER Height & Weight     Self  Normal Incontinent Weight: 105 lb (47.6 kg) Height:  5\' 1"  (154.9 cm)  BEHAVIORAL SYMPTOMS/MOOD NEUROLOGICAL BOWEL NUTRITION STATUS      incontinent Diet: Clear Liquids  AMBULATORY STATUS COMMUNICATION OF NEEDS Skin   Limited Assist Verbally Normal                       Personal Care Assistance Level of Assistance  Bathing, Feeding, Dressing Bathing Assistance: Limited assistance Feeding assistance: Independent Dressing Assistance: Limited assistance     Functional Limitations Info  Sight, Hearing, Speech Sight Info: Adequate Hearing Info: Impaired Speech Info: Adequate    SPECIAL CARE FACTORS FREQUENCY  PT (By licensed PT), OT (By licensed OT)     PT Frequency: 5 OT Frequency: 5            Contractures Contractures Info: Not present    Additional Factors Info  Code Status Code Status Info: No Known Allergies             Current Medications (06/30/2016):  This is the current hospital active medication list Current Facility-Administered Medications  Medication Dose Route Frequency Provider Last Rate Last Dose  . 0.9 %  sodium chloride infusion   Intravenous Continuous Lorretta HarpXilin Niu, MD 75 mL/hr at 06/29/16 2132    . cefTRIAXone (ROCEPHIN) 2 g in dextrose 5 % 50 mL IVPB  2 g Intravenous Q24H Standley Brookinganiel P Goodrich, MD   2 g at 06/30/16 0055  . enoxaparin (LOVENOX) injection 40 mg  40 mg Subcutaneous Q24H Lorretta HarpXilin Niu, MD   40 mg at 06/30/16 0943  . ibuprofen (ADVIL,MOTRIN) 100 MG/5ML suspension 200 mg  200 mg Oral Q6H PRN Lorretta HarpXilin Niu, MD   200 mg at 06/28/16 1738  . liver oil-zinc oxide (DESITIN) 40 % ointment 1 application  1 application Topical Daily PRN Lorretta HarpXilin Niu, MD      . multivitamin with minerals tablet 1 tablet  1 tablet Oral Daily Lorretta HarpXilin Niu, MD   1 tablet at 06/30/16 [phone]  . ondansetron (ZOFRAN) injection 4 mg  4 mg Intravenous Q8H PRN Lorretta HarpXilin Niu, MD      . QUEtiapine (SEROQUEL) tablet 50 mg  50 mg Oral BID Lorretta HarpXilin Niu, MD   50 mg at 06/30/16 0943  . sodium  chloride flush (NS) 0.9 % injection 3 mL  3 mL Intravenous Q12H Lorretta HarpXilin Niu, MD   3 mL at 06/30/16 1000     Discharge Medications: Please see discharge summary for a list of discharge medications.  Relevant Imaging Results:  Relevant Lab Results:   Additional Information ss#[id number]  Clearance CootsNicole A Jermani Eberlein, LCSW

## 2016-06-30 NOTE — Progress Notes (Signed)
OT Cancellation Note  Patient Details Name: Cathy Blackwell MRN: [id number] DOB: 12/09/1918   Cancelled Treatment:    Reason Eval/Treat Not Completed: Patient declined, no reason specified.  Will check back another day.  Kloie Whiting 06/30/2016, 2:34 PM  Marica OtterMaryellen Darenda Fike, OTR/L [phone] 06/30/2016

## 2016-07-01 LAB — GLUCOSE, CAPILLARY: GLUCOSE-CAPILLARY: 89 mg/dL (ref 65–99)

## 2016-07-01 MED ORDER — ENSURE ENLIVE PO LIQD
237.0000 mL | Freq: Three times a day (TID) | ORAL | Status: DC
  Administered 2016-07-01 – 2016-07-02 (×3): 237 mL via ORAL

## 2016-07-01 NOTE — Progress Notes (Signed)
PROGRESS NOTE  Cathy KalesMyrtle L Blackwell JYN:[id number] DOB: 08/14/1919 DOA: 06/27/2016 PCP: No PCP Per Patient  Brief Narrative: 82 year old woman with history of dementia who fell out of wheelchair and trying to get oranges at facility, presented to the emergency department with confusion, fever, leukocytosis, abnormal urinalysis, elevated LFTs. Admitted for UTI with sepsis, acute encephalopathy, abnormal LFTs.  Assessment/Plan: 1. Suspect acute cholecystitis, choledocholithiasis. LFTs trending down on last check or normal suggesting passage of stone and resolution of biliary obstruction. Plan is continue empiric antibiotics, family does not wish to consider surgery for any reason and there is no benefit to GI consultation at this point given normalization of her studies. 2. E coli UTI. Continue Rocephin for now. Follow-up culture data. 3. Acute encephalopathy appears resolved at this point. 4. Fall prior to admission. Suspect mechanical. CT head and neck no acute injury. No evidence of sequela. 5. Aortic atherosclerosis 6. Chronic thrombocytopenia of unknown etiology, stable 7. Chronic normocytic anemia of unknown etiology, appears to be at baseline. 8. Dementia, requires assistance with all ADLs except eating, advanced dementia, typically does not speak at baseline   DVT prophylaxis: Lovenox Code Status: DNR Family Communication: Catalina GravelBrenda Ryals at bedside Dis[facility] Plan: [facility]  Penny PiaVEGA, Stesha Neyens [facility] Direct contact: [phone] --Via amion app OR  --[email or web address]; password [email or web address]  7PM-7AM contact night coverage as above 07/01/2016, 2:04 PM  LOS: 3 days   Consultants:  None  Antimicrobials:  Ceftriaxone 8/14 >>  HPI/Subjective: No acute issues reported overnight.  Objective: Vitals:   06/30/16 0630 06/30/16 1533 06/30/16 2155 07/01/16 0536  BP: 101/82 111/66 112/77 (!) 114/38  Pulse: 61 62 63 64  Resp: 15 16 16 16   Temp: 97.8 F (36.6 C)  97.2 F (36.2 C) 98 F (36.7 C) 98.3 F (36.8 C)  TempSrc: Oral Oral Oral Oral  SpO2: 99% 99% 99% 96%  Weight:      Height:        Intake/Output Summary (Last 24 hours) at 07/01/16 1404 Last data filed at 07/01/16 0940  Gross per 24 hour  Intake           1022.5 ml  Output                0 ml  Net           1022.5 ml     Filed Weights   06/27/16 2116  Weight: 47.6 kg (105 lb)    Exam:    Constitutional:  . Awake and alert and in NAD. Respiratory:  . CTA bilaterally, no w/r/r.  . Respiratory effort normal. No retractions or accessory muscle use Cardiovascular:  . RRR, no m/r/g Abdomen:  . Abdomen appears normal; no tenderness or masses, no RUQ pain Psychiatric:  . Mental status o Nonverbal at baseline  I have personally reviewed following labs and imaging studies:  AST trending down, total bilirubin has normalized. Alkaline phosphatase and ALT normal.  Hemoglobin study, 8.9  Urine culture noted  Scheduled Meds: . cefTRIAXone (ROCEPHIN)  IV  2 g Intravenous Q24H  . enoxaparin (LOVENOX) injection  40 mg Subcutaneous Q24H  . feeding supplement (ENSURE ENLIVE)  237 mL Oral TID BM  . multivitamin with minerals  1 tablet Oral Daily  . QUEtiapine  50 mg Oral BID  . sodium chloride flush  3 mL Intravenous Q12H   Continuous Infusions: . sodium chloride 75 mL/hr at 06/30/16 1758    Principal Problem:   Cholecystitis, acute Active Problems:  UTI (lower urinary tract infection)   Abnormal liver function   Acute encephalopathy   Fall   Hypokalemia   LOS: 3 days   Time spent 30 minutes

## 2016-07-01 NOTE — Progress Notes (Signed)
Patient has to be 24 hrs. Without sitter before she can discharge to [facility]. Per Nurse secretary, patient sitter to discharge at 11:00 am.

## 2016-07-01 NOTE — Progress Notes (Signed)
Nutrition Follow-up  DOCUMENTATION CODES:   Not applicable  INTERVENTION:  Ensure Enlive po TID, each supplement provides 350 kcal and 20 grams of protein. Can decrease to BID as intake increases.  Continue multivitamin with mineral daily.  RD will continue to follow.  NUTRITION DIAGNOSIS:   Inadequate oral intake related to inability to eat as evidenced by NPO status.  Ongoing; addressing with new intervention of Ensure Enlive TID.  GOAL:   Patient will meet greater than or equal to 90% of their needs  Not meeting goal at this time.   MONITOR:   PO intake, I & O's, Diet advancement, Weight trends, Labs  REASON FOR ASSESSMENT:   Malnutrition Screening Tool    ASSESSMENT:   Cathy KalesMyrtle L Dibert is a 82 y.o. female with medical history significant of dementia, hypertension, urinary incontinence, diverticulitis, depression, osteoarthritis, who presents with altered mental status, fall   Team suspects acute cholecystitis, choledocholithiasis, but believe stone has passed and biliary obstruction has resolved as LFTs are trending down. Planning for discharge to SNF when appropriate.  Attempted to awake patient at time of assessment, but she remains lethargic and is unable to answer questions. No family present at this time. RN reported that pt has a poor appetite and has not been finishing even the clear liquid trays. Her first Heart Healthy tray came at lunch, but the pt had only had cranberry sauce, peaches, and bites of mashed potatoes. RN also reports it is difficult to get pt to eat, but believes she may accept Ensure.   Diet was advanced to clear liquid diet on 8/14 and now 8/17 advanced to heart healthy diet.  Meal Completion: 10% of lunch   Medications reviewed and include: multivitamin with mineral daily  Labs reviewed: calcium 7.9, albumin 2.4, total protein 5.4.  Diet Order:  Diet Heart Room service appropriate? Yes; Fluid consistency: Thin  Skin:  Reviewed, no  issues  Last BM:  PTA  Height:   Ht Readings from Last 1 Encounters:  06/27/16 5\' 1"  (1.549 m)    Weight:   Wt Readings from Last 1 Encounters:  06/27/16 105 lb (47.6 kg)    Ideal Body Weight:  47.72 kg  BMI:  Body mass index is 19.84 kg/m.  Estimated Nutritional Needs:   Kcal:  1200-1450 calories  Protein:  50-60 grams  Fluid:  >/= 1.2L  EDUCATION NEEDS:   No education needs identified at this time  Helane RimaLeanne Danessa Mensch, MS, RD, LDN

## 2016-07-02 LAB — CULTURE, BLOOD (ROUTINE X 2): Culture: NO GROWTH

## 2016-07-02 LAB — GLUCOSE, CAPILLARY: GLUCOSE-CAPILLARY: 123 mg/dL — AB (ref 65–99)

## 2016-07-02 MED ORDER — CEFDINIR 300 MG PO CAPS: 300.0000 mg | ORAL_CAPSULE | Freq: Two times a day (BID) | ORAL | 0 refills | Status: AC

## 2016-07-02 NOTE — Clinical Social Work Placement (Signed)
   CLINICAL SOCIAL WORK PLACEMENT  NOTE  Date:  07/02/2016  Patient Details  Name: Cathy KalesMyrtle L Lacosse MRN: [id number] Date of Birth: 12/01/1918  Clinical Social Work is seeking post-discharge placement for this patient at the Skilled  Nursing Facility level of care (*CSW will initial, date and re-position this form in  chart as items are completed):  Yes   Patient/family provided with [facility] Clinical Social Work Department's list of facilities offering this level of care within the geographic area requested by the patient (or if unable, by the patient's family).  Yes   Patient/family informed of their freedom to choose among providers that offer the needed level of care, that participate in Medicare, Medicaid or managed care program needed by the patient, have an available bed and are willing to accept the patient.  Yes   Patient/family informed of [facility]'s ownership interest in [facility] and [facility], as well as of the fact that they are under no obligation to receive care at these facilities.  PASRR submitted to EDS on       PASRR number received on       Existing PASRR number confirmed on 07/01/16     FL2 transmitted to all facilities in geographic area requested by pt/family on       FL2 transmitted to all facilities within larger geographic area on 06/29/16     Patient informed that his/her managed care company has contracts with or will negotiate with certain facilities, including the following:  [facility]     Yes   Patient/family informed of bed offers received.  [facility]     Physician recommends and patient [facility]    [facility] on 07/02/16.  Patient to be transferred to facility by [facility]     Patient family notified on 07/02/16 of transfer.  Name of family member notified:  Daugter:[name]      PHYSICIAN Please sign FL2, Please sign DNR     Additional Comment:    _______________________________________________ Clearance CootsNicole A Huckleberry Martinson, LCSW 07/02/2016, 11:45 AM

## 2016-07-02 NOTE — Progress Notes (Addendum)
Confirmed patient daughter will fill out admissions paper at [facility] at 1:00pm today.

## 2016-07-02 NOTE — Progress Notes (Signed)
Report called to [facility]. Report given to [place], RN. Waiting for [facility] for transportation.  Leida Luton W Saraphina Lauderbaugh, RN

## 2016-07-02 NOTE — Care Management Important Message (Signed)
Important Message  Patient Details  Name: Cathy Blackwell MRN: [id number] Date of Birth: 07/01/1919   Medicare Important Message Given:  Yes    Haskell FlirtJamison, Jaleen Grupp 07/02/2016, 9:41 AMImportant Message  Patient Details  Name: Cathy Blackwell MRN: [id number] Date of Birth: 02/06/1919   Medicare Important Message Given:  Yes    Haskell FlirtJamison, Eryka Dolinger 07/02/2016, 9:41 AM

## 2016-07-02 NOTE — Discharge Summary (Signed)
Physician Discharge Summary  Cathy Blackwell WUJ:[id number] DOB: 05/12/1919 DOA: 06/27/2016  PCP: No PCP Per Patient  Admit date: 06/27/2016 Discharge date: 07/02/2016  Time spent:> 35 minutes  Recommendations for Outpatient Follow-up:  1. Patient will need to complete a seven-day treatment course for complicated UTI. Will require 2 more days of antibiotic therapy. 2. Patient will also need to continue physical therapy while at facility   Discharge Diagnoses:  Principal Problem:   Cholecystitis, acute Active Problems:   UTI (lower urinary tract infection)   Abnormal liver function   Acute encephalopathy   Fall   Hypokalemia   Discharge Condition: stable  Diet recommendation: heart healthy  Filed Weights   06/27/16 2116  Weight: 47.6 kg (105 lb)    History of present illness:  Patient is a 82 year old with history of dementia, hypertension, urinary incontinence who presented with altered mental status secondary to urinary tract infection.  Hospital Course:  Altered mental status - Secondary to infectious etiology. Has resolved. Sitter has been DC'd patient is doing well and presumably back at baseline.  UTI - We will discharge on third generation cephalosporin for another 2 days to complete a seven-day treatment course for complicated UTI - Urine culture growing Escherichia coli.  Procedures:  None  Consultations:  None  Discharge Exam: Vitals:   07/01/16 2136 07/02/16 0510  BP: (!) 142/56 132/63  Pulse: 62 74  Resp: 17 17  Temp: 98.5 F (36.9 C) 98.2 F (36.8 C)    General: Pt in nad, alert and awake Cardiovascular: rrr, no rubs  Respiratory: no increased wob, no wheezes  Discharge Instructions   Discharge Instructions    Call MD for:  redness, tenderness, or signs of infection (pain, swelling, redness, odor or green/yellow discharge around incision site)    Complete by:  As directed   Call MD for:  temperature >100.4    Complete by:  As  directed   Diet - low sodium heart healthy    Complete by:  As directed   Discharge instructions    Complete by:  As directed   Please ensure patient has follow-up with primary care physician at facility.   Increase activity slowly    Complete by:  As directed     Current Discharge Medication List    START taking these medications   Details  cefdinir (OMNICEF) 300 MG capsule Take 1 capsule (300 mg total) by mouth 2 (two) times daily. Qty: 4 capsule, Refills: 0      CONTINUE these medications which have NOT CHANGED   Details  liver oil-zinc oxide (DESITIN) 40 % ointment Apply 1 application topically daily as needed for irritation.    Multiple Vitamin (MULTIVITAMIN WITH MINERALS) TABS Take 1 tablet by mouth daily.    QUEtiapine (SEROQUEL) 50 MG tablet Take 50 mg by mouth 2 (two) times daily.    feeding supplement (ENSURE COMPLETE) LIQD Take 237 mLs by mouth 2 (two) times daily between meals.      STOP taking these medications     cephALEXin (KEFLEX) 500 MG capsule        No Known Allergies    The results of significant diagnostics from this hospitalization (including imaging, microbiology, ancillary and laboratory) are listed below for reference.    Significant Diagnostic Studies: Dg Chest 2 View  Result Date: 06/27/2016 CLINICAL DATA:  82 year old female with 2 falls today. Fever. Initial encounter. EXAM: CHEST  2 VIEW COMPARISON:  Chest radiographs 01/22/2013. FINDINGS: Lung volumes have not significantly  changed. Stable cardiomegaly and mediastinal contours. Calcified aortic atherosclerosis. Bilateral chronic pulmonary interstitial markings are stable. No superimposed pneumothorax, pleural effusion, pulmonary edema or acute pulmonary opacity. No acute osseous abnormality identified. IMPRESSION: No acute cardiopulmonary abnormality. Calcified aortic atherosclerosis. Electronically Signed   By: Odessa FlemingH  Hall M.D.   On: 06/27/2016 20:36   Ct Head Wo Contrast  Result Date:  06/27/2016 CLINICAL DATA:  Dementia. Fall from wheelchair. Abrasion on forehead. EXAM: CT HEAD WITHOUT CONTRAST CT CERVICAL SPINE WITHOUT CONTRAST TECHNIQUE: Multidetector CT imaging of the head and cervical spine was performed following the standard protocol without intravenous contrast. Multiplanar CT image reconstructions of the cervical spine were also generated. COMPARISON:  03/11/2016 FINDINGS: CT HEAD FINDINGS Small remote lacunar infarct in the head of the right caudate nucleus. Otherwise, the brainstem, cerebellum, cerebral peduncles, thalami, basal ganglia, basilar cisterns, and ventricular system appear within normal limits. Periventricular white matter and corona radiata hypodensities favor chronic ischemic microvascular white matter disease. No intracranial hemorrhage, mass lesion, or acute CVA. Minimal frothy material in the left sphenoid sinus suggesting mild chronic sinusitis. CT CERVICAL SPINE FINDINGS Despite efforts by the technologist and patient, motion artifact is present on today's exam and could not be eliminated. This reduces exam sensitivity and specificity. Calcified pannus posterior to the odontoid measures 6 mm in AP dimension. 2.5 mm degenerative anterolisthesis at C3-4 and at C7-T1. Loss of disc height at C4-5, C5-6, and C6-7. No prevertebral soft tissue swelling. Degenerative findings at both temporomandibular joints. Bilateral small C7 cervical ribs. No cervical spine fracture observed. There is evidence of osseous foraminal stenosis on the right at C5-6 and C4-5, and on the left at C5-6, due to uncinate and facet spurring. IMPRESSION: 1. No acute intracranial findings are acute cervical spine findings. 2. Cervical spondylosis causing foraminal impingement at C4-5 and C5-6. 3. Small remote lacunar infarct in the head of the right caudate nucleus. 4. Periventricular white matter and corona radiata hypodensities favor chronic ischemic microvascular white matter disease. 5. Minimal  chronic sphenoid sinusitis. 6. Calcified pannus posterior to the odontoid. 7. Small bilateral C7 cervical ribs. Electronically Signed   By: Gaylyn RongWalter  Liebkemann M.D.   On: 06/27/2016 21:07   Ct Cervical Spine Wo Contrast  Result Date: 06/27/2016 CLINICAL DATA:  Dementia. Fall from wheelchair. Abrasion on forehead. EXAM: CT HEAD WITHOUT CONTRAST CT CERVICAL SPINE WITHOUT CONTRAST TECHNIQUE: Multidetector CT imaging of the head and cervical spine was performed following the standard protocol without intravenous contrast. Multiplanar CT image reconstructions of the cervical spine were also generated. COMPARISON:  03/11/2016 FINDINGS: CT HEAD FINDINGS Small remote lacunar infarct in the head of the right caudate nucleus. Otherwise, the brainstem, cerebellum, cerebral peduncles, thalami, basal ganglia, basilar cisterns, and ventricular system appear within normal limits. Periventricular white matter and corona radiata hypodensities favor chronic ischemic microvascular white matter disease. No intracranial hemorrhage, mass lesion, or acute CVA. Minimal frothy material in the left sphenoid sinus suggesting mild chronic sinusitis. CT CERVICAL SPINE FINDINGS Despite efforts by the technologist and patient, motion artifact is present on today's exam and could not be eliminated. This reduces exam sensitivity and specificity. Calcified pannus posterior to the odontoid measures 6 mm in AP dimension. 2.5 mm degenerative anterolisthesis at C3-4 and at C7-T1. Loss of disc height at C4-5, C5-6, and C6-7. No prevertebral soft tissue swelling. Degenerative findings at both temporomandibular joints. Bilateral small C7 cervical ribs. No cervical spine fracture observed. There is evidence of osseous foraminal stenosis on the right at C5-6 and C4-5,  and on the left at C5-6, due to uncinate and facet spurring. IMPRESSION: 1. No acute intracranial findings are acute cervical spine findings. 2. Cervical spondylosis causing foraminal  impingement at C4-5 and C5-6. 3. Small remote lacunar infarct in the head of the right caudate nucleus. 4. Periventricular white matter and corona radiata hypodensities favor chronic ischemic microvascular white matter disease. 5. Minimal chronic sphenoid sinusitis. 6. Calcified pannus posterior to the odontoid. 7. Small bilateral C7 cervical ribs. Electronically Signed   By: Gaylyn Rong M.D.   On: 06/27/2016 21:07   US Abdomen Complete  Result Date: 06/28/2016 CLINICAL DATA:  Elevated liver function studies and liver enzymes EXAM: ABDOMEN ULTRASOUND COMPLETE COMPARISON:  No recent abdominal studies in PACs FINDINGS: The study is limited due to the patient's dementia and the fact that she is nonverbal. Gallbladder: The gallbladder is distended. There is echogenic material with an area of decreased echogenicity that is almost cystic in nature. This suggest sludge with a noncalcified non shadowing stone within. There is no definite sonographic Murphy's sign. The gallbladder wall is top normal in thickness at 3 mm. Common bile duct: Diameter: 13 mm. No definite sludge or intraluminal stones are observed. Liver: The liver exhibits normal echotexture. There is no focal mass nor ductal dilation. The surface contour of the liver is normal. IVC: No abnormality visualized. Pancreas: Visualized portion unremarkable. Spleen: Size and appearance within normal limits. An accessory spleen measuring up to 1.3 cm in diameter is present. Right Kidney: Length: 8.3 cm. The renal cortical echotexture is mildly increased and is similar to that of the liver. There is cortical thinning. There is no hydronephrosis. Left Kidney: Length: 9.2 cm. The renal cortical echotexture is increased similar to that on the right. There is cortical thinning. There is no hydronephrosis. Abdominal aorta: No aneurysm visualized. Other findings: No ascites is observed. IMPRESSION: 1. Abnormally distended gallbladder containing sludge and a subtle  cystic appearing area within the suspected sludge. Borderline gallbladder wall thickening. No discrete shadowing stones are observed. No definite positive sonographic Murphy's sign but the patient's dimension limits the sensitivity of this exam in detection of the Murphy's sign. 2. Common bile duct dilation. No intraluminal sludge or stones are observed. 3. Normal appearance of the liver and pancreas. 4. Renal cortical thinning bilaterally with increased echotexture consistent with medical renal disease. MRCP would be a useful next imaging step to further evaluate the common bile duct for an etiology of its dilation. Nuclear medicine hepatobiliary scanning may also be useful Electronically Signed   By: David  [place] M.D.   On: 06/28/2016 08:02   Dg Humerus Left  Result Date: 06/28/2016 CLINICAL DATA:  Patient fell out of wheelchair. LEFT shoulder and humerus pain. EXAM: LEFT HUMERUS - 2+ VIEW COMPARISON:  None. FINDINGS: There is no evidence of fracture or other focal bone lesions. No visible dislocation. Soft tissue swelling proximally in the upper arm region laterally. Calcification is seen in the region of the biceps tendon. IMPRESSION: Negative for fracture.  Soft tissue swelling. Electronically Signed   By: Elsie Stain M.D.   On: 06/28/2016 19:28    Microbiology: Recent Results (from the past 240 hour(s))  Culture, blood (routine x 2)     Status: None (Preliminary result)   Collection Time: 06/27/16  9:25 PM  Result Value Ref Range Status   Specimen Description BLOOD BLOOD LEFT FOREARM  Final   Special Requests IN PEDIATRIC BOTTLE 4CC  Final   Culture   Final  NO GROWTH 4 DAYS Performed at [facility]    Report Status PENDING  Incomplete  Culture, blood (routine x 2)     Status: None (Preliminary result)   Collection Time: 06/27/16  9:50 PM  Result Value Ref Range Status   Specimen Description BLOOD BLOOD RIGHT FOREARM  Final   Special Requests BOTTLES DRAWN AEROBIC AND  ANAEROBIC 5CC EA  Final   Culture   Final    NO GROWTH 3 DAYS Performed at [facility]    Report Status PENDING  Incomplete  Urine culture     Status: Abnormal   Collection Time: 06/27/16 11:05 PM  Result Value Ref Range Status   Specimen Description URINE, CATHETERIZED  Final   Special Requests NONE  Final   Culture >=100,000 COLONIES/mL ESCHERICHIA COLI (A)  Final   Report Status 06/30/2016 FINAL  Final   Organism ID, Bacteria ESCHERICHIA COLI (A)  Final      Susceptibility   Escherichia coli - MIC*    AMPICILLIN >=32 RESISTANT Resistant     CEFAZOLIN <=4 SENSITIVE Sensitive     CEFTRIAXONE <=1 SENSITIVE Sensitive     CIPROFLOXACIN <=0.25 SENSITIVE Sensitive     GENTAMICIN <=1 SENSITIVE Sensitive     IMIPENEM <=0.25 SENSITIVE Sensitive     NITROFURANTOIN <=16 SENSITIVE Sensitive     TRIMETH/SULFA >=320 RESISTANT Resistant     AMPICILLIN/SULBACTAM >=32 RESISTANT Resistant     PIP/TAZO <=4 SENSITIVE Sensitive     Extended ESBL NEGATIVE Sensitive     * >=100,000 COLONIES/mL ESCHERICHIA COLI  MRSA PCR Screening     Status: None   Collection Time: 06/28/16  2:34 AM  Result Value Ref Range Status   MRSA by PCR NEGATIVE NEGATIVE Final    Comment:        The GeneXpert MRSA Assay (FDA approved for NASAL specimens only), is one component of a comprehensive MRSA colonization surveillance program. It is not intended to diagnose MRSA infection nor to guide or monitor treatment for MRSA infections.      Labs: Basic Metabolic Panel:  Recent Labs Lab 06/27/16 2153 06/28/16 0512 06/29/16 0539  NA 133* 138 137  K 3.3* 3.1* 3.8  CL 102 111 111  CO2 24 23 21*  GLUCOSE 133* 122* 76  BUN 11 12 10   CREATININE 0.59 0.58 0.63  CALCIUM 8.2* 7.5* 7.9*  MG  --  1.3*  --    Liver Function Tests:  Recent Labs Lab 06/27/16 2153 06/28/16 0512 06/29/16 0539  AST 110* 77* 58*  ALT 72* 54 45  ALKPHOS 143* 110 105  BILITOT 2.3* 1.7* 0.7  PROT 7.1 5.8* 5.4*  ALBUMIN  3.1* 2.6* 2.4*    Recent Labs Lab 06/28/16 0226  LIPASE 20    Recent Labs Lab 06/28/16 0752  AMMONIA 30   CBC:  Recent Labs Lab 06/27/16 2153 06/28/16 0512 06/29/16 0539  WBC 15.6* 11.5* 7.8  NEUTROABS 12.6*  --   --   HGB 9.8* 8.5* 8.9*  HCT 28.6* 25.3* 26.4*  MCV 88.8 90.4 89.5  PLT 111* 97* PLATELET CLUMPS NOTED ON SMEAR, UNABLE TO ESTIMATE   Cardiac Enzymes: No results for input(s): CKTOTAL, CKMB, CKMBINDEX, TROPONINI in the last 168 hours. BNP: BNP (last 3 results) No results for input(s): BNP in the last 8760 hours.  ProBNP (last 3 results) No results for input(s): PROBNP in the last 8760 hours.  CBG:  Recent Labs Lab 06/28/16 1103 06/29/16 1610 06/30/16 9604 07/01/16 0733 07/02/16  0655  GLUCAP 107* 87 87 89 123*    Signed:  Penny Pia MD.  [facility] 07/02/2016, 9:55 AM

## 2016-07-03 LAB — CULTURE, BLOOD (ROUTINE X 2): Culture: NO GROWTH

## 2017-02-06 IMAGING — US US ABDOMEN COMPLETE
1 series · 13 of 25 positions shown · non-contrast
Comparison: No recent abdominal studies in PACs

CLINICAL DATA: Elevated liver function studies and liver enzymes

EXAM:
ABDOMEN ULTRASOUND COMPLETE

[Series 1: us abdomen complete · 0.17mm/px · 13 of 90 slices shown]
[im 1/90]
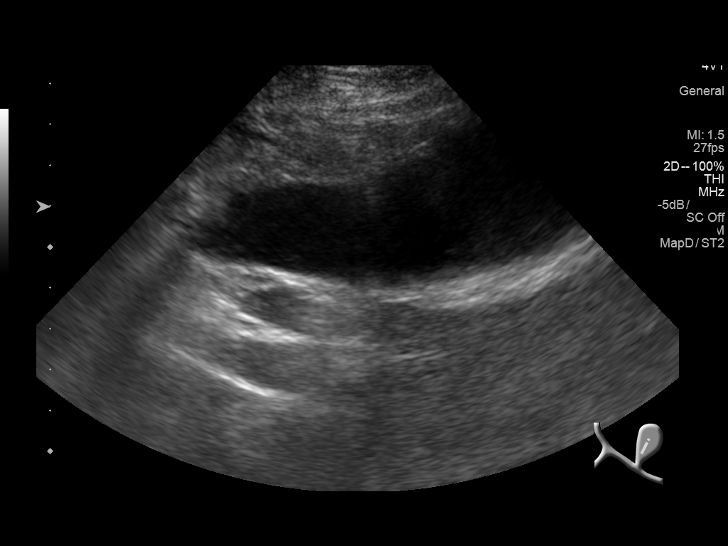
[im 8/90]
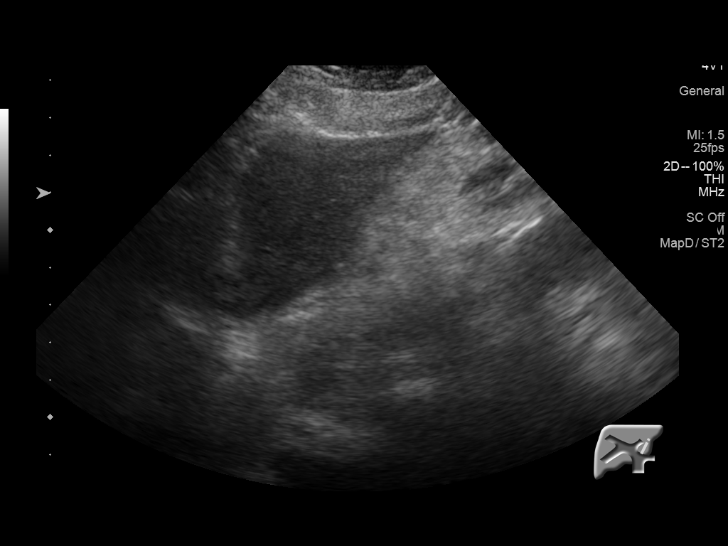
[im 15/90]
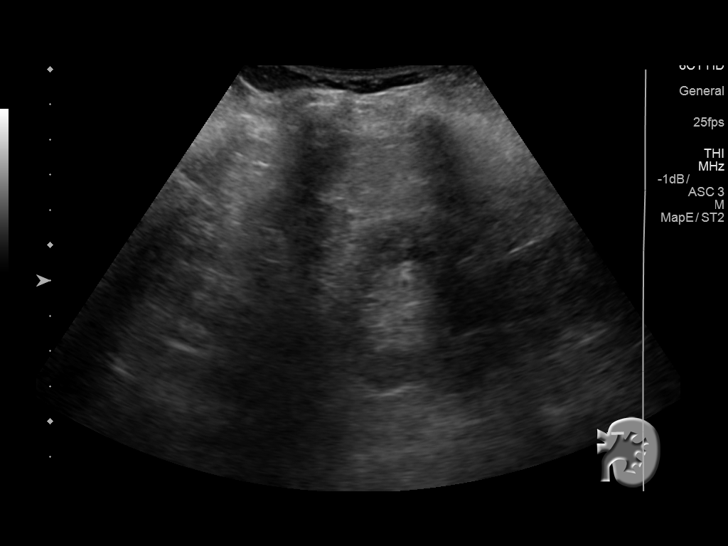
[im 23/90]
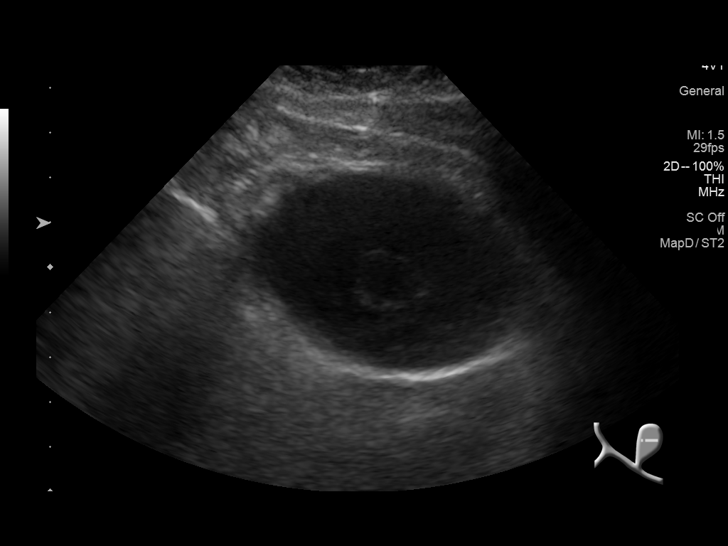
[im 30/90]
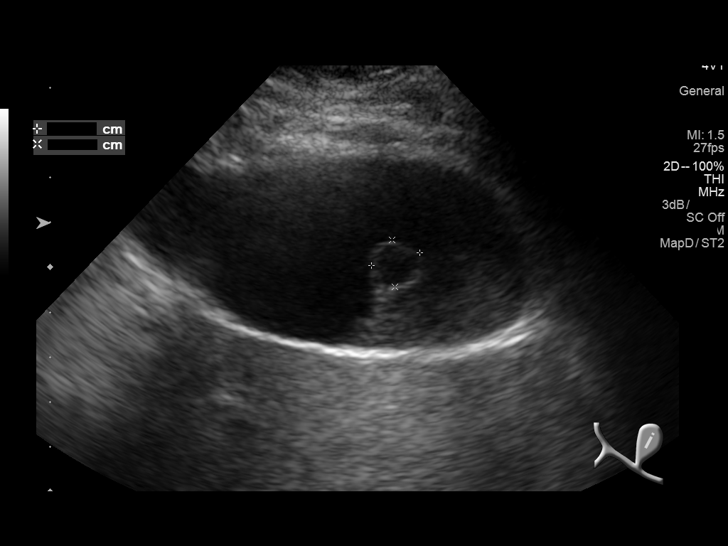
[im 38/90]
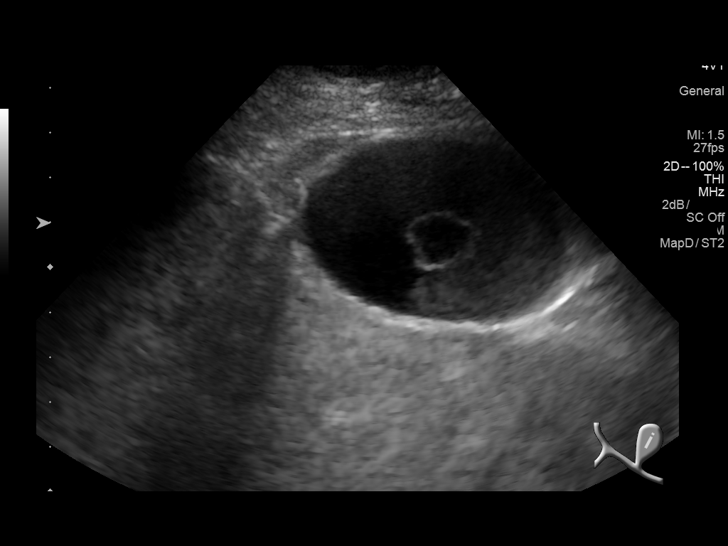
[im 45/90]
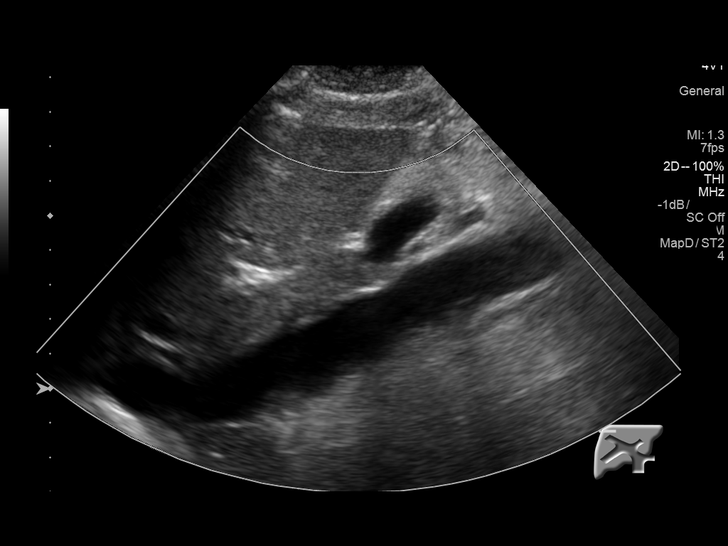
[im 52/90]
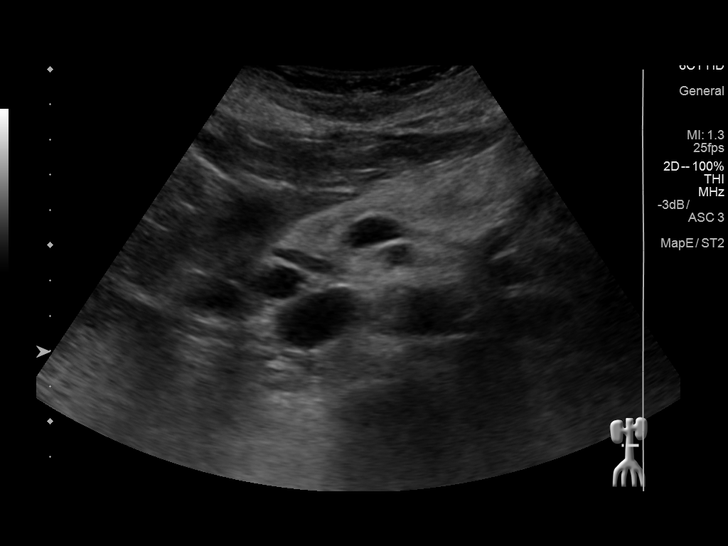
[im 60/90]
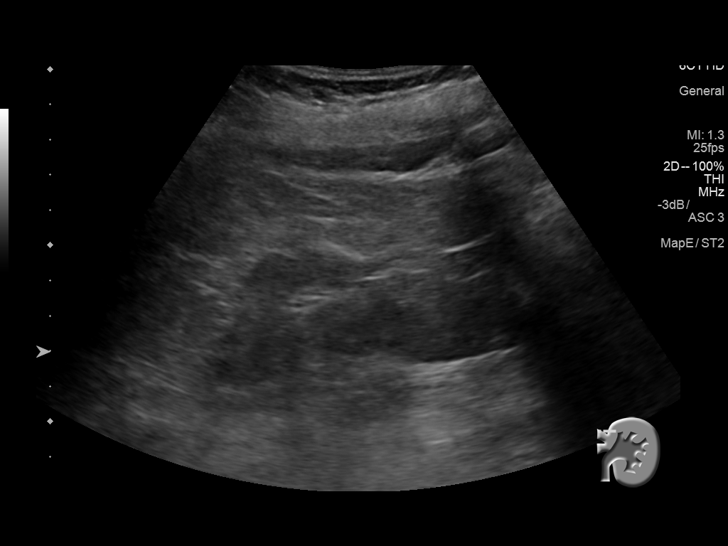
[im 67/90]
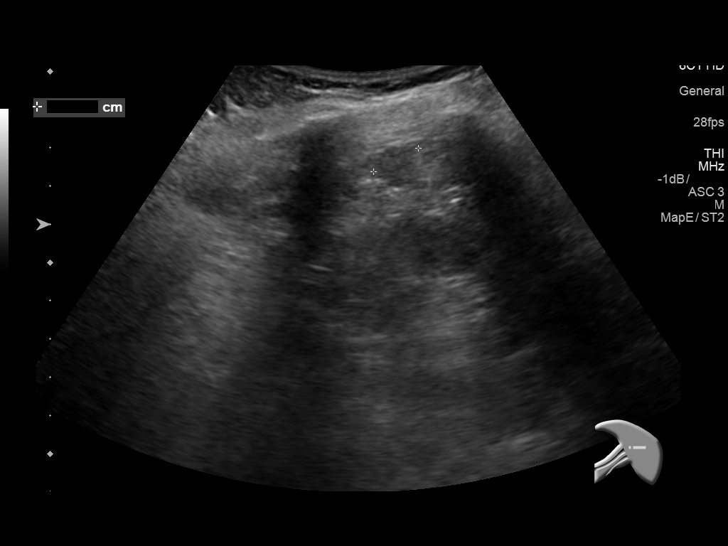
[im 75/90]
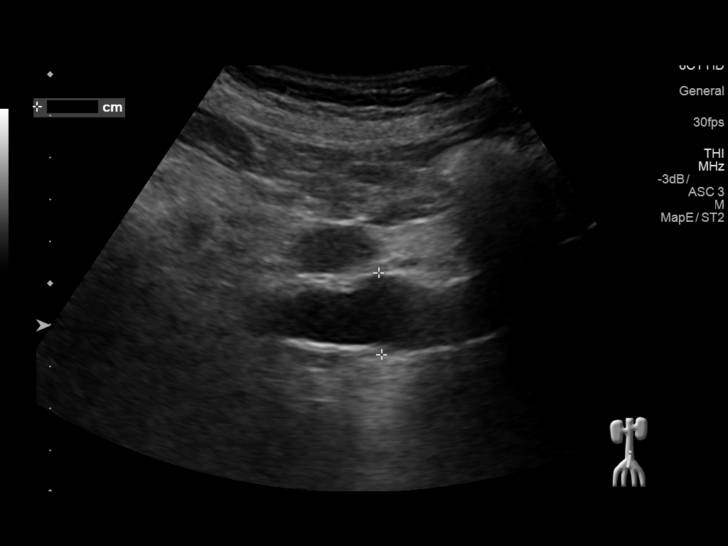
[im 82/90]
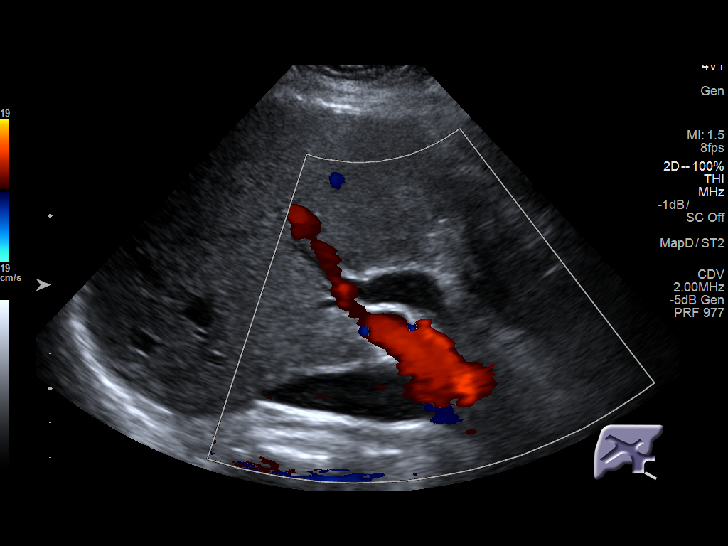
[im 90/90]
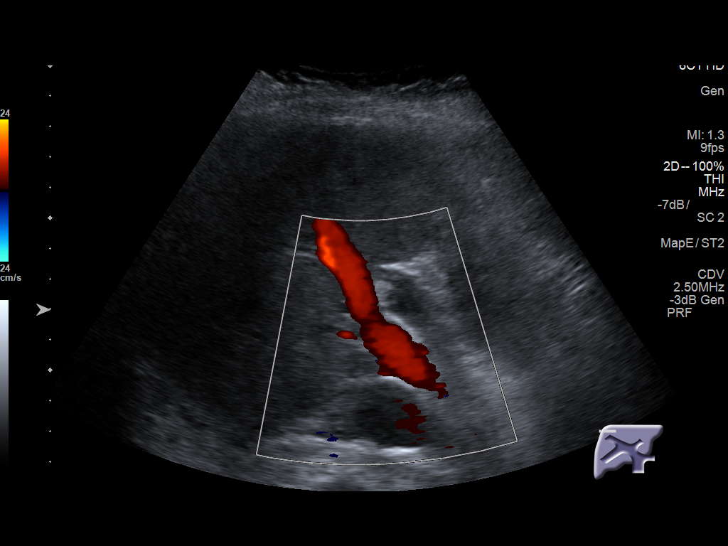

[13 of 25 positions shown; findings below may reference images not displayed]

FINDINGS: The study is limited due to the patient's dementia and the fact that
she is nonverbal.

Gallbladder: The gallbladder is distended. There is echogenic
material with an area of decreased echogenicity that is almost
cystic in nature. This suggest sludge with a noncalcified non
shadowing stone within. There is no definite sonographic Murphy's
sign. The gallbladder wall is top normal in thickness at 3 mm.

Common bile duct: Diameter: 13 mm. No definite sludge or
intraluminal stones are observed.

Liver: The liver exhibits normal echotexture. There is no focal mass
nor ductal dilation. The surface contour of the liver is normal.

IVC: No abnormality visualized.

Pancreas: Visualized portion unremarkable.

Spleen: Size and appearance within normal limits. An accessory
spleen measuring up to 1.3 cm in diameter is present.

Right Kidney: Length: 8.3 cm. The renal cortical echotexture is
mildly increased and is similar to that of the liver. There is
cortical thinning. There is no hydronephrosis.

Left Kidney: Length: 9.2 cm.. The renal cortical echotexture is
increased similar to that on the right. There is cortical thinning.
There is no hydronephrosis.

Abdominal aorta: No aneurysm visualized.

Other findings: No ascites is observed.
IMPRESSION: 1. Abnormally distended gallbladder containing sludge and a subtle
cystic appearing area within the suspected sludge. Borderline
gallbladder wall thickening. No discrete shadowing stones are
observed. No definite positive sonographic Murphy's sign but the
patient's dimension limits the sensitivity of this exam in detection
of the Murphy's sign.
2. Common bile duct dilation. No intraluminal sludge or stones are
observed.
3. Normal appearance of the liver and pancreas.
4. Renal cortical thinning bilaterally with increased echotexture
consistent with medical renal disease.
MRCP would be a useful next imaging step to further evaluate the
common bile duct for an etiology of its dilation. Nuclear medicine
hepatobiliary scanning may also be useful

## 2017-08-15 DEATH — deceased

## 2018-04-27 IMAGING — CT CT HEAD W/O CM
3 of 8 series · 13 of 47 positions shown, 15 images · non-contrast
Comparison: 03/11/2016

CLINICAL DATA: Dementia. Fall from wheelchair. Abrasion on
forehead.

EXAM:
CT HEAD WITHOUT CONTRAST
CT CERVICAL SPINE WITHOUT CONTRAST
TECHNIQUE: Multidetector CT imaging of the head and cervical spine was
performed following the standard protocol without intravenous
contrast. Multiplanar CT image reconstructions of the cervical spine
were also generated.

[Series 6: sagittal · sagittal · 0.27mm/px · 2 of 74 slices shown]
[im 25/74  brain]
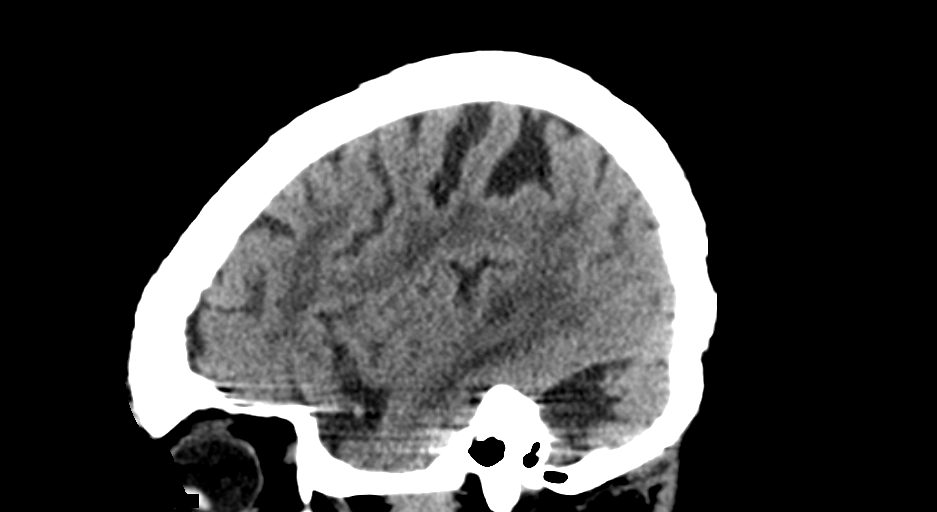
[im 49/74  brain]
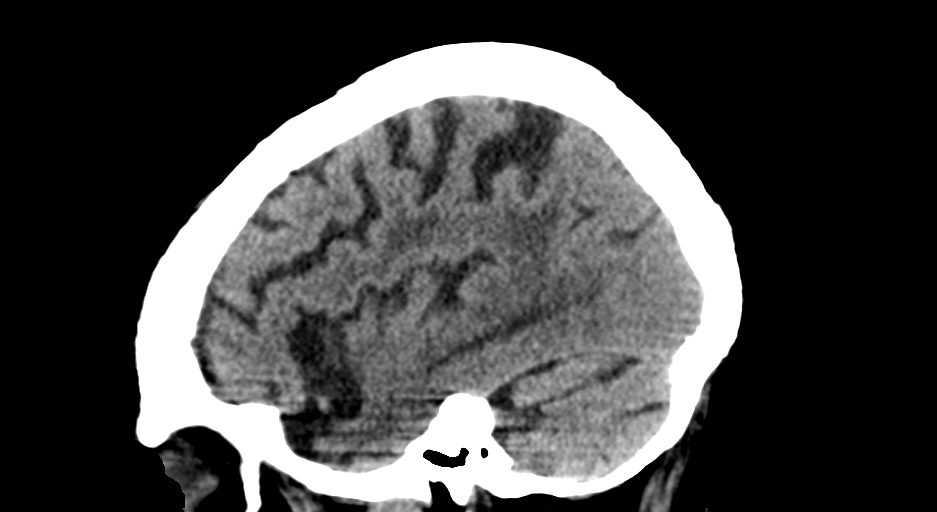

[Series 10: axial recon · axial · 0.23mm/px · z∈[+1002,+1131]mm · 8 of 91 slices shown, 10 images]
[im 9/91  brain]
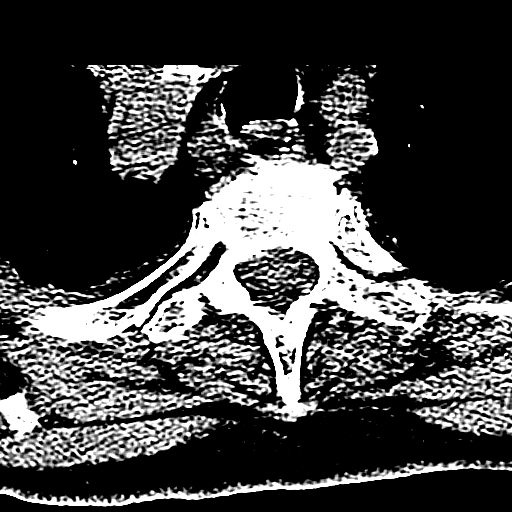
[im 9/91  bone]
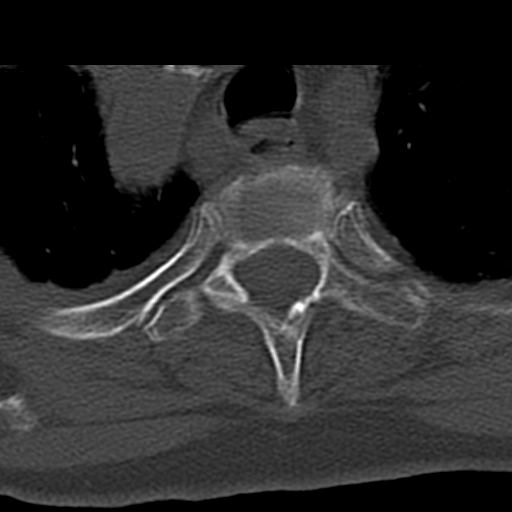
[im 17/91  brain]
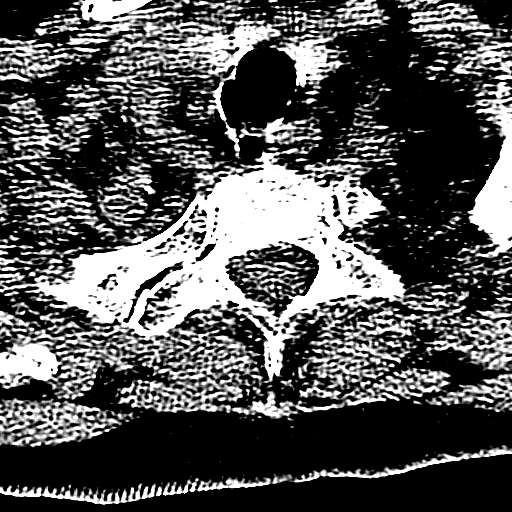
[im 33/91  brain]
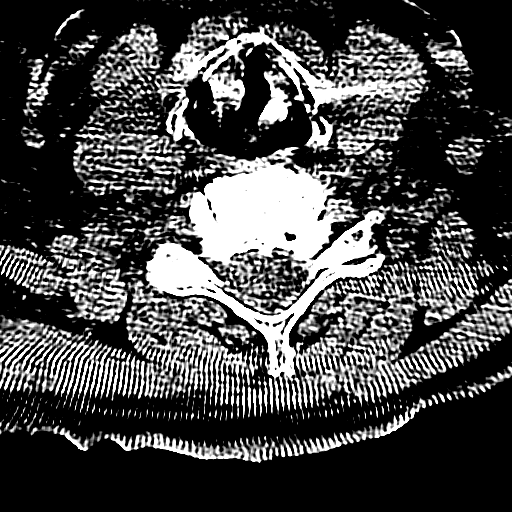
[im 41/91  brain]
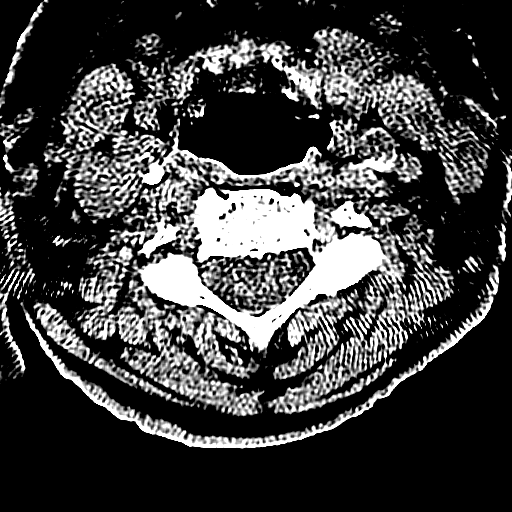
[im 50/91  brain]
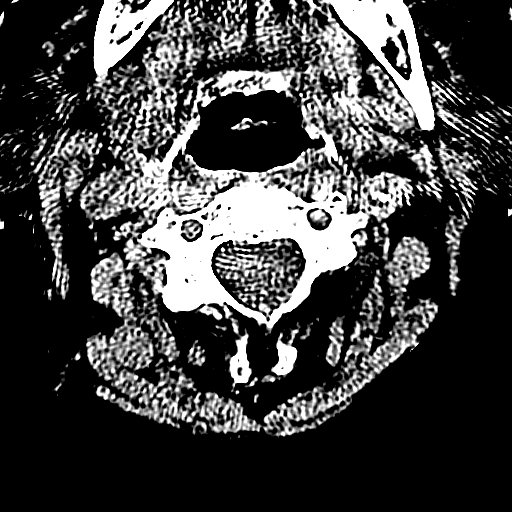
[im 50/91  bone]
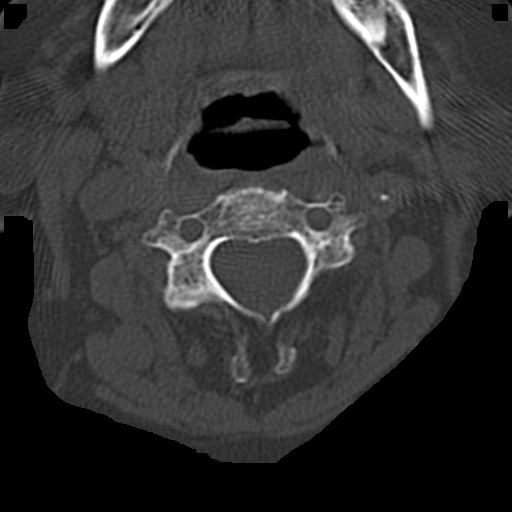
[im 58/91  brain]
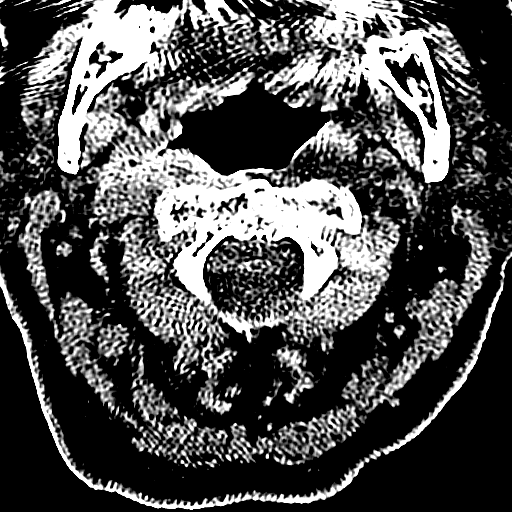
[im 74/91  brain]
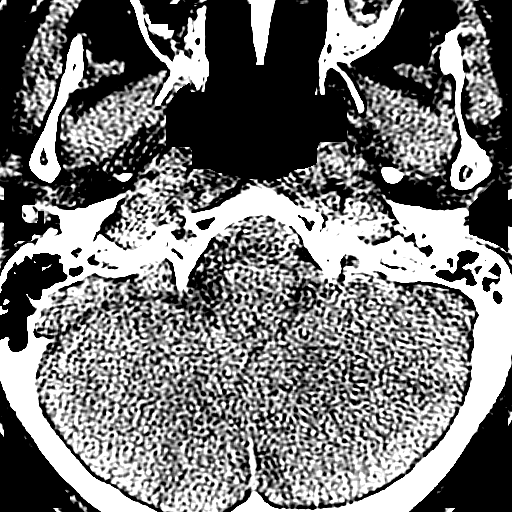
[im 82/91  brain]
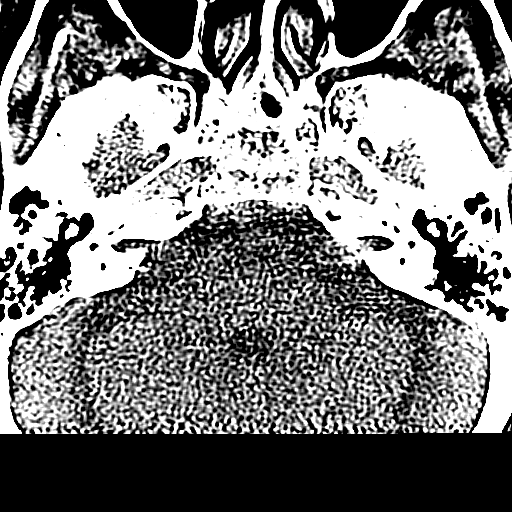

[Series 11: coronal · coronal · 0.24mm/px · 3 of 61 slices shown]
[im 18/61  brain]
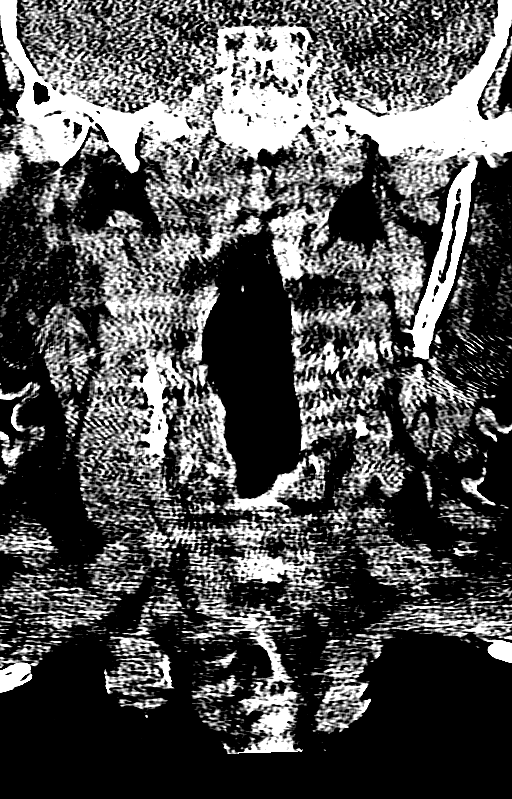
[im 32/61  brain]
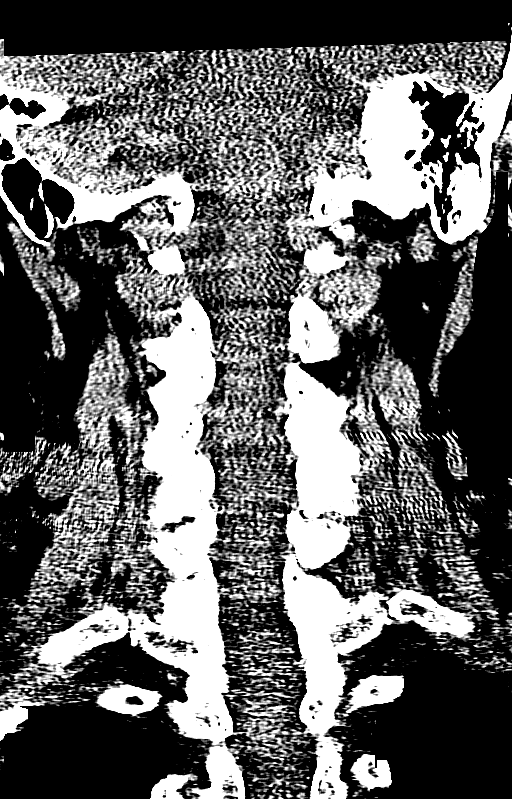
[im 46/61  brain]
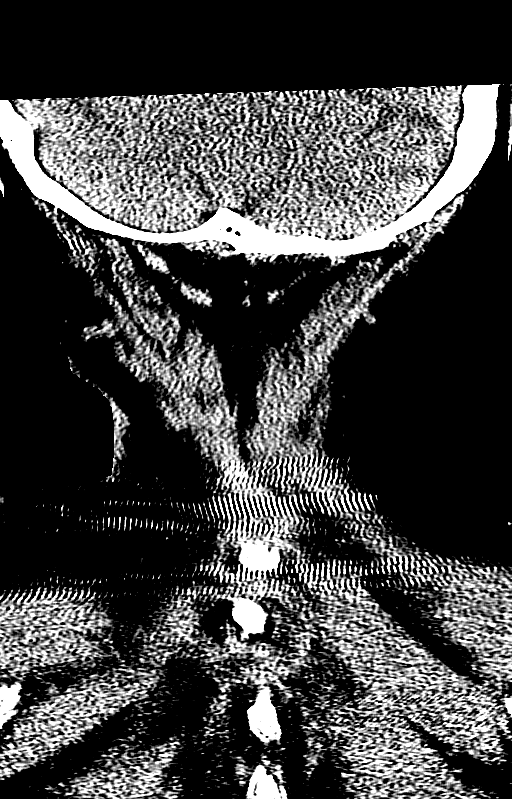

[13 of 47 positions shown; findings below may reference images not displayed]

FINDINGS: CT HEAD FINDINGS

Small remote lacunar infarct in the head of the right caudate
nucleus. Otherwise, the brainstem, cerebellum, cerebral peduncles,
thalami, basal ganglia, basilar cisterns, and ventricular system
appear within normal limits. Periventricular white matter and corona
radiata hypodensities favor chronic ischemic microvascular white
matter disease. No intracranial hemorrhage, mass lesion, or acute
CVA.

Minimal frothy material in the left sphenoid sinus suggesting mild
chronic sinusitis.

CT CERVICAL SPINE FINDINGS

Despite efforts by the technologist and patient, motion artifact is
present on today's exam and could not be eliminated. This reduces
exam sensitivity and specificity. Calcified pannus posterior to the
odontoid measures 6 mm in AP dimension. 2.5 mm degenerative
anterolisthesis at C3-4 and at C7-T1. Loss of disc height at C4-5,
C5-6, and C6-7. No prevertebral soft tissue swelling.

Degenerative findings at both temporomandibular joints.

Bilateral small C7 cervical ribs. No cervical spine fracture
observed. There is evidence of osseous foraminal stenosis on the
right at C5-6 and C4-5, and on the left at C5-6, due to uncinate and
facet spurring.
IMPRESSION: 1. No acute intracranial findings are acute cervical spine findings.
2. Cervical spondylosis causing foraminal impingement at C4-5 and
C5-6.
3. Small remote lacunar infarct in the head of the right caudate
nucleus.
4. Periventricular white matter and corona radiata hypodensities
favor chronic ischemic microvascular white matter disease.
5. Minimal chronic sphenoid sinusitis.
6. Calcified pannus posterior to the odontoid.
7. Small bilateral C7 cervical ribs.

## 2018-04-28 IMAGING — DX DG HUMERUS 2V *L*
2 series · 2 of 2 positions shown · non-contrast
Comparison: None.

CLINICAL DATA: Patient fell out of wheelchair. LEFT shoulder and
humerus pain.

EXAM:
LEFT HUMERUS - 2+ VIEW

[humerus ap (1 of 2)]
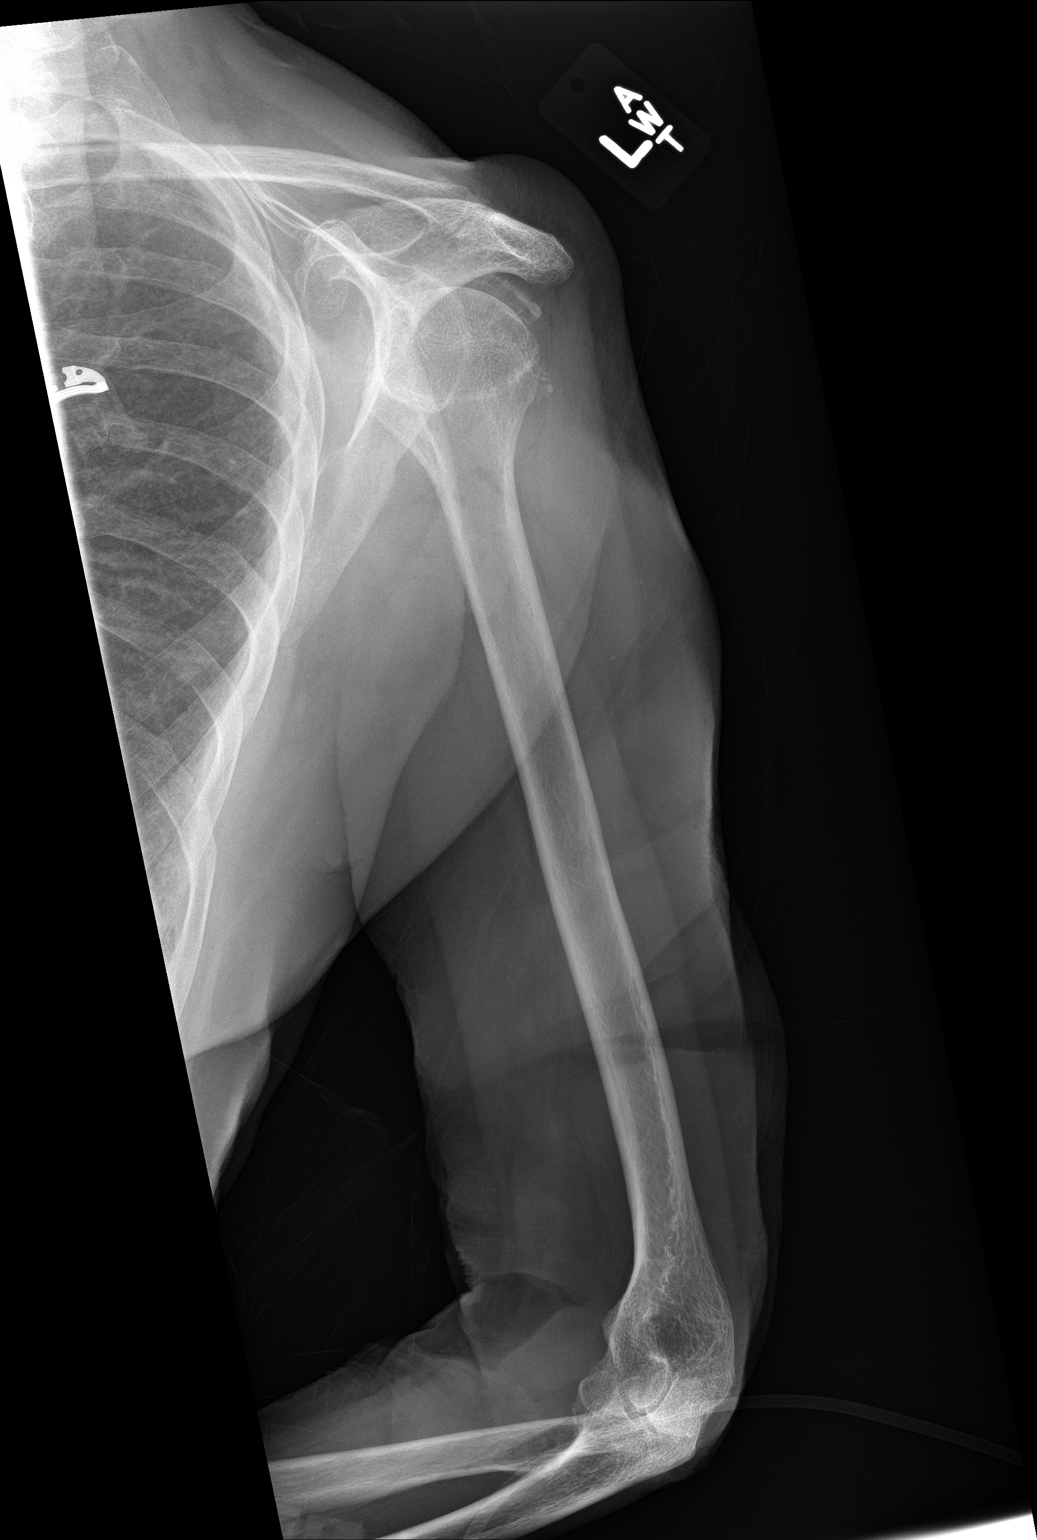

[humerus ap (2 of 2)]
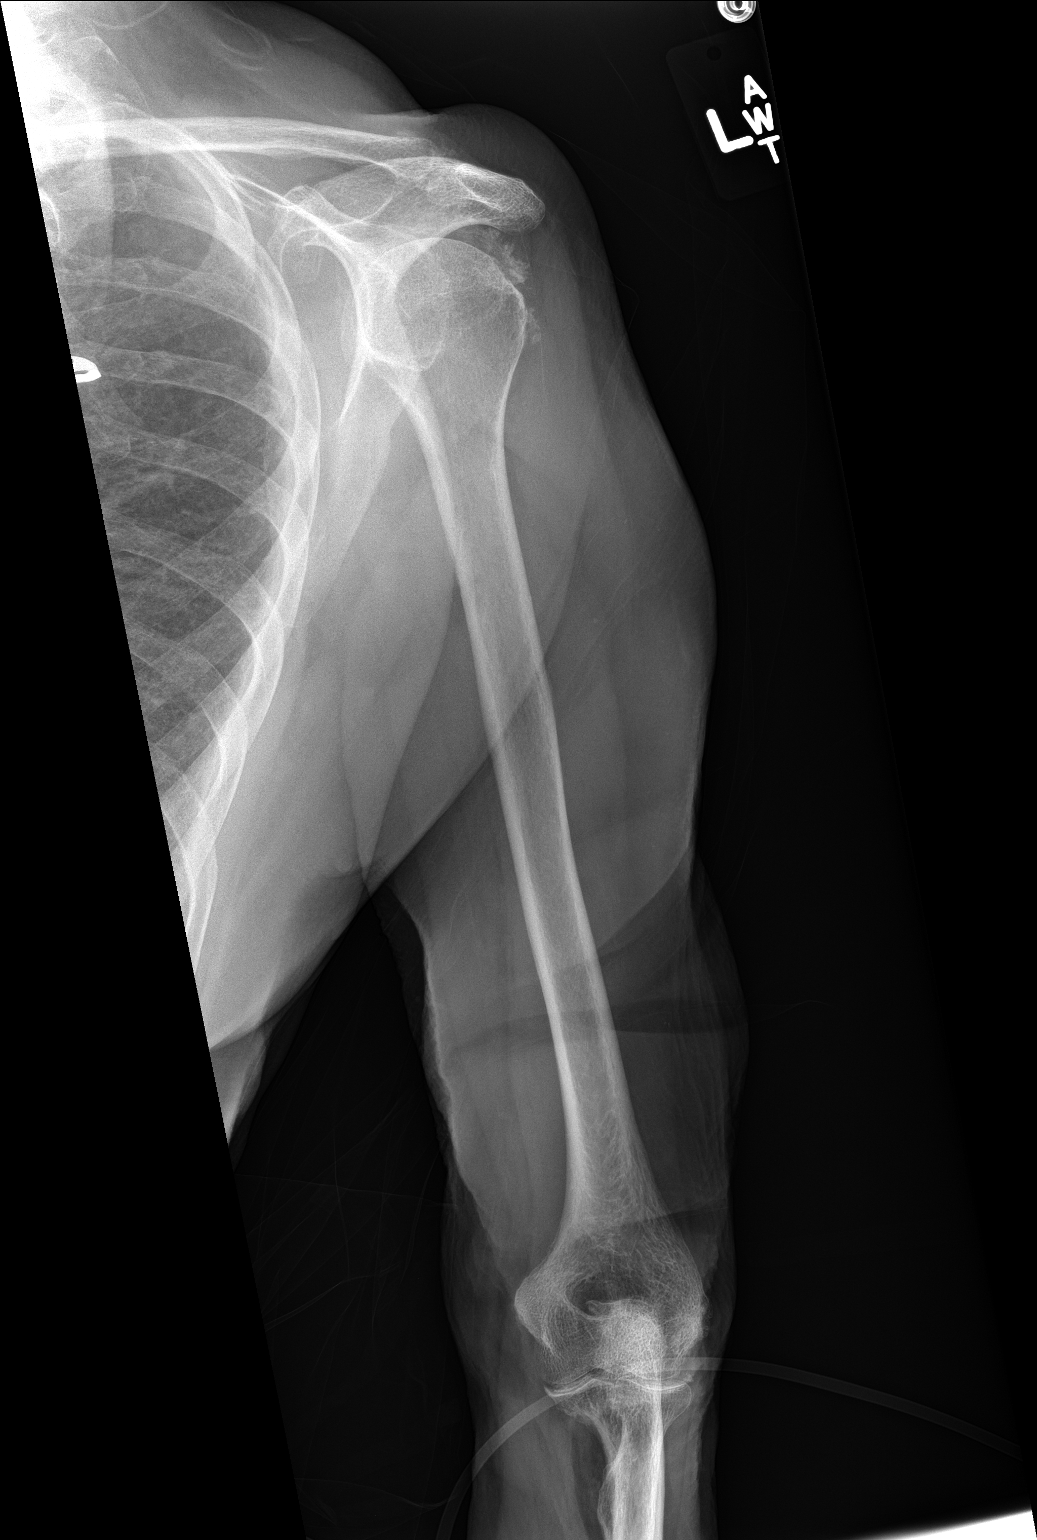

[2 of 2 positions shown; findings below may reference images not displayed]

FINDINGS: There is no evidence of fracture or other focal bone lesions. No
visible dislocation. Soft tissue swelling proximally in the upper
arm region laterally. Calcification is seen in the region of the
biceps tendon.
IMPRESSION: Negative for fracture.  Soft tissue swelling.
